# Patient Record
Sex: Male | Born: 1990 | Race: White | Hispanic: No | Marital: Married | Smoking: Former smoker
Health system: Southern US, Community
[De-identification: ages and names within clinical notes are randomized; demographics above are authoritative.]

## PROBLEM LIST (undated history)

## (undated) DIAGNOSIS — F32A Depression, unspecified: Secondary | ICD-10-CM

## (undated) DIAGNOSIS — N200 Calculus of kidney: Secondary | ICD-10-CM

## (undated) DIAGNOSIS — F329 Major depressive disorder, single episode, unspecified: Secondary | ICD-10-CM

## (undated) DIAGNOSIS — Z87442 Personal history of urinary calculi: Secondary | ICD-10-CM

## (undated) DIAGNOSIS — F419 Anxiety disorder, unspecified: Secondary | ICD-10-CM

## (undated) HISTORY — DX: Anxiety disorder, unspecified: F41.9

## (undated) HISTORY — PX: APPENDECTOMY: SHX54

---
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
## 2008-11-23 ENCOUNTER — Emergency Department: Payer: Self-pay | Admitting: Emergency Medicine

## 2010-07-05 ENCOUNTER — Emergency Department: Payer: Self-pay | Admitting: Emergency Medicine

## 2011-06-29 ENCOUNTER — Emergency Department: Payer: Self-pay | Admitting: Emergency Medicine

## 2011-07-01 ENCOUNTER — Emergency Department: Payer: Self-pay | Admitting: Emergency Medicine

## 2011-10-01 ENCOUNTER — Emergency Department: Payer: Self-pay | Admitting: Emergency Medicine

## 2011-11-05 ENCOUNTER — Emergency Department: Payer: Self-pay | Admitting: Emergency Medicine

## 2011-12-11 ENCOUNTER — Emergency Department: Payer: Self-pay | Admitting: Emergency Medicine

## 2012-12-10 ENCOUNTER — Emergency Department: Payer: Self-pay

## 2012-12-10 LAB — DRUG SCREEN, URINE
Barbiturates, Ur Screen: NEGATIVE (ref ?–200)
Benzodiazepine, Ur Scrn: NEGATIVE (ref ?–200)
Cocaine Metabolite,Ur ~~LOC~~: NEGATIVE (ref ?–300)
MDMA (Ecstasy)Ur Screen: NEGATIVE (ref ?–500)
Methadone, Ur Screen: NEGATIVE (ref ?–300)
Phencyclidine (PCP) Ur S: NEGATIVE (ref ?–25)
Tricyclic, Ur Screen: NEGATIVE (ref ?–1000)

## 2012-12-10 LAB — COMPREHENSIVE METABOLIC PANEL
Albumin: 4.4 g/dL (ref 3.4–5.0)
Alkaline Phosphatase: 108 U/L (ref 50–136)
Bilirubin,Total: 0.4 mg/dL (ref 0.2–1.0)
Calcium, Total: 9.2 mg/dL (ref 8.5–10.1)
EGFR (African American): >60
Osmolality: 272 (ref 275–301)
Total Protein: 8.6 g/dL — ABNORMAL HIGH (ref 6.4–8.2)

## 2012-12-10 LAB — URINALYSIS, COMPLETE
Bacteria: NONE SEEN
Bilirubin,UR: NEGATIVE
Blood: NEGATIVE
Ph: 7 (ref 4.5–8.0)
Squamous Epithelial: <1

## 2012-12-10 LAB — CBC
HCT: 44.7 % (ref 40.0–52.0)
MCH: 28.9 pg (ref 26.0–34.0)
MCHC: 33.1 g/dL (ref 32.0–36.0)
MCV: 87 fL (ref 80–100)
RDW: 13.9 % (ref 11.5–14.5)

## 2013-08-28 ENCOUNTER — Observation Stay: Payer: Self-pay | Admitting: Surgery

## 2013-08-28 LAB — COMPREHENSIVE METABOLIC PANEL
AST: 44 U/L — AB (ref 15–37)
Albumin: 4.4 g/dL (ref 3.4–5.0)
Alkaline Phosphatase: 101 U/L
Anion Gap: 8 (ref 7–16)
BUN: 9 mg/dL (ref 7–18)
Bilirubin,Total: 0.6 mg/dL (ref 0.2–1.0)
CHLORIDE: 102 mmol/L (ref 98–107)
Calcium, Total: 9.4 mg/dL (ref 8.5–10.1)
Co2: 29 mmol/L (ref 21–32)
Creatinine: 1.17 mg/dL (ref 0.60–1.30)
GLUCOSE: 97 mg/dL (ref 65–99)
Osmolality: 276 (ref 275–301)
Potassium: 3.7 mmol/L (ref 3.5–5.1)
SGPT (ALT): 46 U/L
Sodium: 139 mmol/L (ref 136–145)
TOTAL PROTEIN: 9.1 g/dL — AB (ref 6.4–8.2)

## 2013-08-28 LAB — CBC
HCT: 45.5 % (ref 40.0–52.0)
HGB: 15.1 g/dL (ref 13.0–18.0)
MCH: 29.1 pg (ref 26.0–34.0)
MCHC: 33.1 g/dL (ref 32.0–36.0)
MCV: 88 fL (ref 80–100)
PLATELETS: 248 10*3/uL (ref 150–440)
RBC: 5.18 10*6/uL (ref 4.40–5.90)
RDW: 15.1 % — AB (ref 11.5–14.5)
WBC: 17.2 10*3/uL — ABNORMAL HIGH (ref 3.8–10.6)

## 2013-08-28 LAB — URINALYSIS, COMPLETE
BACTERIA: NONE SEEN
BLOOD: NEGATIVE
Bilirubin,UR: NEGATIVE
Glucose,UR: NEGATIVE mg/dL (ref 0–75)
Ketone: NEGATIVE
LEUKOCYTE ESTERASE: NEGATIVE
NITRITE: NEGATIVE
Ph: 5 (ref 4.5–8.0)
Protein: NEGATIVE
RBC,UR: 1 /HPF (ref 0–5)
SPECIFIC GRAVITY: 1.023 (ref 1.003–1.030)
Squamous Epithelial: NONE SEEN

## 2013-08-28 LAB — LIPASE, BLOOD: LIPASE: 226 U/L (ref 73–393)

## 2013-08-30 LAB — PATHOLOGY REPORT

## 2014-05-20 NOTE — Op Note (Signed)
PATIENT NAME:  Adam Palmer, Adam Palmer MR#:  [id number] DATE OF BIRTH:  1990-06-20  DATE OF PROCEDURE:  08/28/2013  PREOPERATIVE DIAGNOSIS: Acute appendicitis.   POSTOPERATIVE DIAGNOSIS: Acute appendicitis.   SURGEON: Claude MangesWilliam F. Yomara Toothman, MD  ANESTHESIA: General.   OPERATION PERFORMED: Laparoscopic appendectomy.   PROCEDURE IN DETAIL: The patient was placed supine on the operating room table and prepped and draped in the usual sterile fashion. A 15 mmHg CO2 pneumoperitoneum was created via a Hasson cannula that was introduced in the supraumbilical midline amidst horizontal mattress sutures of 0 Vicryl. Two additional 5 mm trocars were placed under direct visualization. The appendix was inflamed with minimal, if any, exudate and no surrounding pus. It was retrocecal in location. The mesoappendix was taken down with the Harmonic scalpel, and the appendectomy was performed to include a sliver of the cecal base with the Endo GIA stapling device. It was placed in an Endo Catch bag and extracted from the abdomen via the epigastric port. The right lower quadrant was irrigated with warm normal saline. This was suctioned clear, and the peritoneum was desufflated and decannulated, and the linea alba was closed with a single interrupted 0 PDS suture. The previously placed Vicryls were tied one to another, and all 3 skin sites were closed with subcuticular 5-0 Monocryl and suture strips. The patient tolerated the procedure well. There were no complications.    ____________________________ Claude MangesWilliam F. Blease Capaldi, MD wfm:jb D: 08/28/2013 09:09:00 ET T: 08/28/2013 10:32:18 ET JOB#: [id number]  cc: Claude MangesWilliam F. Keyara Ent, MD, <Dictator> Claude MangesWILLIAM F Matie Dimaano MD ELECTRONICALLY SIGNED 08/28/2013 15:26

## 2014-05-20 NOTE — H&P (Signed)
   Subjective/Chief Complaint RLQ pain, nausea, chills x 24 hours   History of Present Illness Adam Palmer is a relatively healthy 24 yo M who presents with 1 day of acute onset, worsening/nonresolving RLQ pain, nausea and chills.  Began suddenly, worse with movement.  Last PO 5 pm.  Has never had before.  No sick contacts, no unusual ingestions.   Past History Kidney stones   Past Med/Surgical Hx:  Denies medical history:   Denies surgical history.:   denies:   ALLERGIES:  No Known Allergies:   Family and Social History:  Family History Diabetes Mellitus   Social History positive  tobacco, positive ETOH, + chewing tobacco   Place of Living Home   Review of Systems:  Subjective/Chief Complaint RLQ pain, nausea, chills   Fever/Chills Yes   Cough No   Sputum No   Abdominal Pain Yes   Diarrhea No   Constipation No   Nausea/Vomiting Yes   SOB/DOE No   Chest Pain No   Dysuria No   Tolerating PT No   Tolerating Diet Nauseated   Physical Exam:  GEN well developed, well nourished, no acute distress   HEENT pink conjunctivae, PERRL, moist oral mucosa, good dentition   RESP normal resp effort  clear BS   CARD regular rate  no murmur   ABD positive tenderness  denies Flank Tenderness  no liver/spleen enlargement   LYMPH negative neck, negative axillae   SKIN normal to palpation, No rashes, No ulcers, skin turgor good   NEURO cranial nerves intact, negative Babinski R/L, negative rigidity, negative tremor   PSYCH A+O to time, place, person, good insight    Assessment/Admission Diagnosis RLQ pain, nausea, chills.  + leukocytosis, Thickened dilated appendix with periappendiceal stranding on CT scan.  Clinical, laboratory and radiographic appendicitis.   Plan Admit, IVF, IV abx.  Lap appendectomy in am with daytime surgeon Marterre.   Electronic Signatures: Jarvis NewcomerLundquist, Izick A (MD)  (Signed 02-Aug-15 04:09)  Authored: CHIEF COMPLAINT and HISTORY, PAST  MEDICAL/SURGIAL HISTORY, ALLERGIES, FAMILY AND SOCIAL HISTORY, REVIEW OF SYSTEMS, PHYSICAL EXAM, ASSESSMENT AND PLAN   Last Updated: 02-Aug-15 04:09 by Jarvis NewcomerLundquist, Clayborne A (MD)

## 2015-08-23 ENCOUNTER — Emergency Department
Admission: EM | Admit: 2015-08-23 | Discharge: 2015-08-23 | Disposition: A | Discharge status: Home / Self Care | Payer: Managed Care, Other (non HMO) | Attending: Student | Admitting: Student

## 2015-08-23 ENCOUNTER — Emergency Department: Payer: Managed Care, Other (non HMO)

## 2015-08-23 DIAGNOSIS — R1031 Right lower quadrant pain: Secondary | ICD-10-CM | POA: Diagnosis present

## 2015-08-23 DIAGNOSIS — N23 Unspecified renal colic: Secondary | ICD-10-CM | POA: Insufficient documentation

## 2015-08-23 DIAGNOSIS — N200 Calculus of kidney: Secondary | ICD-10-CM

## 2015-08-23 DIAGNOSIS — R109 Unspecified abdominal pain: Secondary | ICD-10-CM

## 2015-08-23 LAB — URINALYSIS COMPLETE WITH MICROSCOPIC (ARMC ONLY)
BILIRUBIN URINE: NEGATIVE
Bacteria, UA: NONE SEEN
GLUCOSE, UA: NEGATIVE mg/dL
Ketones, ur: NEGATIVE mg/dL
LEUKOCYTES UA: NEGATIVE
Nitrite: NEGATIVE
PH: 7 (ref 5.0–8.0)
Protein, ur: NEGATIVE mg/dL
SPECIFIC GRAVITY, URINE: 1.016 (ref 1.005–1.030)
Squamous Epithelial / LPF: NONE SEEN

## 2015-08-23 LAB — CBC WITH DIFFERENTIAL/PLATELET
BASOS ABS: 0.1 10*3/uL (ref 0–0.1)
Basophils Relative: 1 %
Eosinophils Absolute: 0.2 10*3/uL (ref 0–0.7)
Eosinophils Relative: 3 %
HCT: 45.4 % (ref 40.0–52.0)
HEMOGLOBIN: 15.5 g/dL (ref 13.0–18.0)
LYMPHS ABS: 3.2 10*3/uL (ref 1.0–3.6)
LYMPHS PCT: 34 %
MCH: 29.4 pg (ref 26.0–34.0)
MCHC: 34.2 g/dL (ref 32.0–36.0)
MCV: 85.8 fL (ref 80.0–100.0)
Monocytes Absolute: 1 10*3/uL (ref 0.2–1.0)
Monocytes Relative: 11 %
NEUTROS ABS: 4.7 10*3/uL (ref 1.4–6.5)
NEUTROS PCT: 51 %
PLATELETS: 231 10*3/uL (ref 150–440)
RBC: 5.28 MIL/uL (ref 4.40–5.90)
RDW: 14.8 % — ABNORMAL HIGH (ref 11.5–14.5)
WBC: 9.2 10*3/uL (ref 3.8–10.6)

## 2015-08-23 LAB — BASIC METABOLIC PANEL
ANION GAP: 8 (ref 5–15)
BUN: 10 mg/dL (ref 6–20)
CHLORIDE: 102 mmol/L (ref 101–111)
CO2: 29 mmol/L (ref 22–32)
Calcium: 9.5 mg/dL (ref 8.9–10.3)
Creatinine, Ser: 1.1 mg/dL (ref 0.61–1.24)
GFR calc Af Amer: >60 mL/min (ref >60)
GLUCOSE: 115 mg/dL — AB (ref 65–99)
POTASSIUM: 3.4 mmol/L — AB (ref 3.5–5.1)
SODIUM: 139 mmol/L (ref 135–145)

## 2015-08-23 MED ORDER — TAMSULOSIN HCL 0.4 MG PO CAPS: 0.4000 mg | ORAL_CAPSULE | Freq: Every day | ORAL | 0 refills | Status: DC

## 2015-08-23 MED ORDER — HYDROMORPHONE HCL 1 MG/ML IJ SOLN
1.0000 mg | Freq: Once | INTRAMUSCULAR | Status: AC
  Administered 2015-08-23: 1 mg via INTRAVENOUS

## 2015-08-23 MED ORDER — ONDANSETRON 4 MG PO TBDP
4.0000 mg | ORAL_TABLET | Freq: Three times a day (TID) | ORAL | 0 refills | Status: DC | PRN
Start: 2015-08-23 — End: 2015-09-09

## 2015-08-23 MED ORDER — HYDROMORPHONE HCL 1 MG/ML IJ SOLN
INTRAMUSCULAR | Status: AC
  Administered 2015-08-23: 1 mg via INTRAVENOUS
  Filled 2015-08-23: qty 1

## 2015-08-23 MED ORDER — ONDANSETRON HCL 4 MG/2ML IJ SOLN
INTRAMUSCULAR | Status: AC
  Administered 2015-08-23: 4 mg via INTRAVENOUS
  Filled 2015-08-23: qty 2

## 2015-08-23 MED ORDER — OXYCODONE HCL 5 MG PO TABS
10.0000 mg | ORAL_TABLET | Freq: Once | ORAL | Status: AC
  Administered 2015-08-23: 10 mg via ORAL
  Filled 2015-08-23: qty 2

## 2015-08-23 MED ORDER — OXYCODONE HCL 5 MG PO TABS: 5.0000 mg | ORAL_TABLET | Freq: Four times a day (QID) | ORAL | 0 refills | Status: DC | PRN

## 2015-08-23 MED ORDER — ONDANSETRON HCL 4 MG/2ML IJ SOLN
4.0000 mg | Freq: Once | INTRAMUSCULAR | Status: AC
  Administered 2015-08-23: 4 mg via INTRAVENOUS

## 2015-08-23 MED ORDER — SODIUM CHLORIDE 0.9 % IV BOLUS (SEPSIS)
1000.0000 mL | Freq: Once | INTRAVENOUS | Status: AC
  Administered 2015-08-23: 1000 mL via INTRAVENOUS

## 2015-08-23 NOTE — ED Notes (Signed)
Pt returned from US, resting in bed, family at bedside 

## 2015-08-23 NOTE — ED Notes (Signed)
Patient transported to Ultrasound 

## 2015-08-23 NOTE — ED Notes (Signed)
Pt resting in bed and family at the bedside.

## 2015-08-23 NOTE — ED Provider Notes (Signed)
[facility] Emergency Department Provider Note   ____________________________________________   First MD Initiated Contact with Patient 08/23/15 0700     (approximate)  I have reviewed the triage vital signs and the nursing notes.   HISTORY  Chief Complaint Flank Pain    HPI DEZMON TORONTO is a 25 y.o. male with history of anxiety, prior kidney stones who presents for evaluation of sudden onset right flank pain since 3 AM, constant, severe, feels like his prior kidney stones. He has also had associated nonbloody nonbilious emesis. No diarrhea, fevers or chills. Denies dysuria or hematuria. Last bowel movement was yesterday. No chest pain or difficulty breathing.   No past medical history on file.  There are no active problems to display for this patient.   No past surgical history on file.  Prior to Admission medications   Not on File    Allergies Review of patient's allergies indicates no known allergies.  No family history on file.  Social History Social History  Substance Use Topics  . Smoking status: Not on file  . Smokeless tobacco: Not on file  . Alcohol use Not on file    Review of Systems Constitutional: No fever/chills Eyes: No visual changes. ENT: No sore throat. Cardiovascular: Denies chest pain. Respiratory: Denies shortness of breath. Gastrointestinal: No abdominal pain.  + nausea, + vomiting.  No diarrhea.  No constipation. Genitourinary: Negative for dysuria. Musculoskeletal: Negative for back pain. Skin: Negative for rash. Neurological: Negative for headaches, focal weakness or numbness.  10-point ROS otherwise negative.  ____________________________________________   PHYSICAL EXAM:  VITAL SIGNS: ED Triage Vitals  Enc Vitals Group     BP 08/23/15 0625 (!) 157/82     Pulse Rate 08/23/15 0625 66     Resp 08/23/15 0625 18     Temp 08/23/15 0625 97.5 F (36.4 C)     Temp Source 08/23/15 0625 Oral    SpO2 08/23/15 0625 100 %     Weight --      Height --      Head Circumference --      Peak Flow --      Pain Score 08/23/15 0622 10     Pain Loc --      Pain Edu? --      Excl. in GC? --     Constitutional: Alert and oriented. Well appearing and in no acute distress. Eyes: Conjunctivae are normal. PERRL. EOMI. Head: Atraumatic. Nose: No congestion/rhinnorhea. Mouth/Throat: Mucous membranes are moist.  Oropharynx non-erythematous. Neck: No stridor.   Cardiovascular: Normal rate, regular rhythm. Grossly normal heart sounds.  Good peripheral circulation. Respiratory: Normal respiratory effort.  No retractions. Lungs CTAB. Gastrointestinal: Soft and nontender. No distention. No CVA tenderness. Genitourinary: deferred Musculoskeletal: No lower extremity tenderness nor edema.  No joint effusions. Neurologic:  Normal speech and language. No gross focal neurologic deficits are appreciated. No gait instability. Skin:  Skin is warm, dry and intact. No rash noted. Psychiatric: Mood and affect are normal. Speech and behavior are normal.  ____________________________________________   LABS (all labs ordered are listed, but only abnormal results are displayed)  Labs Reviewed  CBC WITH DIFFERENTIAL/PLATELET - Abnormal; Notable for the following:       Result Value   RDW 14.8 (*)    All other components within normal limits  BASIC METABOLIC PANEL - Abnormal; Notable for the following:    Potassium 3.4 (*)    Glucose, Bld 115 (*)    All other  components within normal limits  URINALYSIS COMPLETEWITH MICROSCOPIC ([facility] ONLY) - Abnormal; Notable for the following:    Color, Urine YELLOW (*)    APPearance CLEAR (*)    Hgb urine dipstick 3+ (*)    All other components within normal limits   ____________________________________________  EKG  none ____________________________________________  RADIOLOGY  Renal Ultrasound IMPRESSION: Mild RIGHT hydronephrosis.  No intrarenal  calculi. Electronically Signed  ____________________________________________   PROCEDURES  Procedure(s) performed: None  Procedures  Critical Care performed: No  ____________________________________________   INITIAL IMPRESSION / ASSESSMENT AND PLAN / ED COURSE  Pertinent labs & imaging results that were available during my care of the patient were reviewed by me and considered in my medical decision making (see chart for details).  AASHRITH EVES is a 26 y.o. male with history of anxiety, prior kidney stones who presents for evaluation of sudden onset right flank pain since 3 AM, constant, severe, feels like his prior kidney stones. On exam, he is generally well-appearing and in no acute distress after receiving Dilaudid and Zofran and fluids prior to my arrival. Currently he reports he feels much better. His vital signs are stable, he is afebrile. He has a benign abdominal exam. No CVA tenderness. Reading labs, renal ultrasound and treat him symptomatically as I suspect this may represent a recurrent kidney stone. Reassess for disposition.  ----------------------------------------- 9:19 AM on 08/23/2015 -----------------------------------------  patient with significant improvement of his pain at this time, he is tolerating by mouth intake without vomiting. His vital signs are stable. Workup was remarkable for hematuria, renal ultrasound shows mild right hydronephrosis likely secondary to small/nonobstructing ureteral stone which does not appear infected. We'll discharge with expectant management. We discussed return precautions, need for close urology follow-up and he is comfortable with the discharge plan. DC home.  Clinical Course     ____________________________________________   FINAL CLINICAL IMPRESSION(S) / ED DIAGNOSES  Final diagnoses:  Flank pain  Ureteral colic  Nephrolithiasis      NEW MEDICATIONS STARTED DURING THIS VISIT:  New Prescriptions   No  medications on file     Note:  This document was prepared using Dragon voice recognition software and may include unintentional dictation errors.    Gayla Doss, MD 08/23/15 [phone]

## 2015-08-23 NOTE — ED Triage Notes (Signed)
Patient ambulatory to triage with steady gait, without difficulty or distress noted; pt reports right flank pain radiating into right side/abdomen; st hx kidney stones

## 2015-08-23 NOTE — ED Notes (Signed)
Pt resting in bed w/ family at the bedside.

## 2015-09-03 ENCOUNTER — Ambulatory Visit: Payer: Self-pay | Admitting: Urology

## 2015-09-09 ENCOUNTER — Emergency Department
Admission: EM | Admit: 2015-09-09 | Discharge: 2015-09-09 | Disposition: A | Discharge status: Home / Self Care | Payer: Managed Care, Other (non HMO) | Attending: Emergency Medicine | Admitting: Emergency Medicine

## 2015-09-09 ENCOUNTER — Encounter: Payer: Self-pay | Admitting: Emergency Medicine

## 2015-09-09 ENCOUNTER — Emergency Department: Payer: Managed Care, Other (non HMO)

## 2015-09-09 DIAGNOSIS — Z87891 Personal history of nicotine dependence: Secondary | ICD-10-CM | POA: Diagnosis not present

## 2015-09-09 DIAGNOSIS — N200 Calculus of kidney: Secondary | ICD-10-CM | POA: Insufficient documentation

## 2015-09-09 DIAGNOSIS — R109 Unspecified abdominal pain: Secondary | ICD-10-CM | POA: Diagnosis present

## 2015-09-09 HISTORY — DX: Calculus of kidney: N20.0

## 2015-09-09 LAB — CBC WITH DIFFERENTIAL/PLATELET
BASOS PCT: 1 %
Basophils Absolute: 0.1 10*3/uL (ref 0–0.1)
EOS ABS: 0.4 10*3/uL (ref 0–0.7)
Eosinophils Relative: 3 %
HCT: 43 % (ref 40.0–52.0)
HEMOGLOBIN: 14.6 g/dL (ref 13.0–18.0)
Lymphocytes Relative: 36 %
Lymphs Abs: 4.4 10*3/uL — ABNORMAL HIGH (ref 1.0–3.6)
MCH: 29.3 pg (ref 26.0–34.0)
MCHC: 33.9 g/dL (ref 32.0–36.0)
MCV: 86.5 fL (ref 80.0–100.0)
Monocytes Absolute: 1.4 10*3/uL — ABNORMAL HIGH (ref 0.2–1.0)
Monocytes Relative: 12 %
NEUTROS PCT: 48 %
Neutro Abs: 6 10*3/uL (ref 1.4–6.5)
Platelets: 241 10*3/uL (ref 150–440)
RBC: 4.97 MIL/uL (ref 4.40–5.90)
RDW: 13.8 % (ref 11.5–14.5)
WBC: 12.3 10*3/uL — AB (ref 3.8–10.6)

## 2015-09-09 LAB — BASIC METABOLIC PANEL
Anion gap: 5 (ref 5–15)
BUN: 15 mg/dL (ref 6–20)
CALCIUM: 9.5 mg/dL (ref 8.9–10.3)
CO2: 29 mmol/L (ref 22–32)
CREATININE: 1.09 mg/dL (ref 0.61–1.24)
Chloride: 103 mmol/L (ref 101–111)
Glucose, Bld: 100 mg/dL — ABNORMAL HIGH (ref 65–99)
Potassium: 3.4 mmol/L — ABNORMAL LOW (ref 3.5–5.1)
Sodium: 137 mmol/L (ref 135–145)

## 2015-09-09 LAB — URINALYSIS COMPLETE WITH MICROSCOPIC (ARMC ONLY)
Bilirubin Urine: NEGATIVE
GLUCOSE, UA: NEGATIVE mg/dL
Ketones, ur: NEGATIVE mg/dL
Leukocytes, UA: NEGATIVE
NITRITE: NEGATIVE
Protein, ur: 30 mg/dL — AB
SPECIFIC GRAVITY, URINE: 1.028 (ref 1.005–1.030)
pH: 5 (ref 5.0–8.0)

## 2015-09-09 MED ORDER — HYDROMORPHONE HCL 1 MG/ML IJ SOLN
INTRAMUSCULAR | Status: AC
  Filled 2015-09-09: qty 1

## 2015-09-09 MED ORDER — ONDANSETRON HCL 4 MG/2ML IJ SOLN
4.0000 mg | Freq: Once | INTRAMUSCULAR | Status: AC
  Administered 2015-09-09: 4 mg via INTRAVENOUS

## 2015-09-09 MED ORDER — OXYCODONE-ACETAMINOPHEN 5-325 MG PO TABS
ORAL_TABLET | ORAL | Status: AC
  Filled 2015-09-09: qty 1

## 2015-09-09 MED ORDER — ONDANSETRON HCL 4 MG/2ML IJ SOLN
INTRAMUSCULAR | Status: AC
  Filled 2015-09-09: qty 2

## 2015-09-09 MED ORDER — ONDANSETRON 4 MG PO TBDP: 4.0000 mg | ORAL_TABLET | Freq: Three times a day (TID) | ORAL | 0 refills | Status: DC | PRN

## 2015-09-09 MED ORDER — OXYCODONE-ACETAMINOPHEN 5-325 MG PO TABS
1.0000 | ORAL_TABLET | Freq: Once | ORAL | Status: AC
  Administered 2015-09-09: 1 via ORAL

## 2015-09-09 MED ORDER — HYDROMORPHONE HCL 1 MG/ML IJ SOLN
1.0000 mg | Freq: Once | INTRAMUSCULAR | Status: AC
  Administered 2015-09-09: 1 mg via INTRAVENOUS

## 2015-09-09 MED ORDER — OXYCODONE HCL 5 MG PO TABS: 5.0000 mg | ORAL_TABLET | Freq: Four times a day (QID) | ORAL | 0 refills | Status: DC | PRN

## 2015-09-09 MED ORDER — SODIUM CHLORIDE 0.9 % IV BOLUS (SEPSIS)
1000.0000 mL | Freq: Once | INTRAVENOUS | Status: AC
  Administered 2015-09-09: 1000 mL via INTRAVENOUS

## 2015-09-09 MED ORDER — TAMSULOSIN HCL 0.4 MG PO CAPS: 0.4000 mg | ORAL_CAPSULE | Freq: Every day | ORAL | 0 refills | Status: DC

## 2015-09-09 NOTE — ED Notes (Signed)
Patient transported to CT 

## 2015-09-09 NOTE — ED Triage Notes (Signed)
Patient with right flank pain that radiates to right groin.

## 2015-09-09 NOTE — ED Provider Notes (Addendum)
[facility] Emergency Department Provider Note  ____________________________________________   I have reviewed the triage vital signs and the nursing notes.   HISTORY  Chief Complaint Flank Pain    HPI Adam Palmer is a 25 y.o. male who presents today complaining of flank pain. Patient was seen and evaluated for this on the 27thright flank pain persists. Denies dysuria or significant hematuria. Patient states that the pain seemed to get better after his ultrasound here but then it got worse again so he went to his primary care doctor they did an ultrasound and found more kidney stones, he has not had a CT with this investigation of his kidney stone pain, but he has not had pain off and on for last 16 days. Became acutely worse again this morning at 3 AM. Severe right sided kidney stone like pain. No radiation nothing makes it better and nothing makes it worse. Patient did take his last pill this morning, pain pill. He also has yet to follow up with urology, he doesn't appointment he states with [facility] urology but he elected in the interim to go to see his primary care doctor.     Past Medical History:  Diagnosis Date  . Kidney calculi     There are no active problems to display for this patient.   Past Surgical History:  Procedure Laterality Date  . APPENDECTOMY      Current Outpatient Rx  . Order #: [id number] Class: Print  . Order #: [id number] Class: Print  . Order #: [id number] Class: Print    Allergies Review of patient's allergies indicates no known allergies.  No family history on file.  Social History Social History  Substance Use Topics  . Smoking status: Former Games developer  . Smokeless tobacco: Current User    Types: Chew  . Alcohol use Yes    Review of Systems Constitutional: No fever/chills Eyes: No visual changes. ENT: No sore throat. No stiff neck no neck pain Cardiovascular: Denies chest pain. Respiratory: Denies  shortness of breath. Gastrointestinal:   no vomiting.  No diarrhea.  No constipation. Genitourinary: Negative for dysuria. Musculoskeletal: Negative lower extremity swelling Skin: Negative for rash. Neurological: Negative for severe headaches, focal weakness or numbness. 10-point ROS otherwise negative.  ____________________________________________   PHYSICAL EXAM:  VITAL SIGNS: ED Triage Vitals  Enc Vitals Group     BP 09/09/15 0452 (!) 145/100     Pulse Rate 09/09/15 0452 71     Resp 09/09/15 0452 (!) 22     Temp 09/09/15 0452 97.3 F (36.3 C)     Temp Source 09/09/15 0452 Oral     SpO2 09/09/15 0452 100 %     Weight 09/09/15 0453 193 lb (87.5 kg)     Height 09/09/15 0453  (1.727 m)     Head Circumference --      Peak Flow --      Pain Score 09/09/15 0453 10     Pain Loc --      Pain Edu? --      Excl. in GC? --     Constitutional: Alert and oriented. Pacing around the room looking uncomfortable Mouth/Throat: Mucous membranes are moist.  Oropharynx non-erythematous. Neck: No stridor.   Nontender with no meningismus Cardiovascular: Normal rate, regular rhythm. Grossly normal heart sounds.  Good peripheral circulation. Respiratory: Normal respiratory effort.  No retractions. Lungs CTAB. Abdominal: Soft and nontender. No distention. No guarding no rebound Back:  There is no focal tenderness or step off.  there is no midline tenderness there are no lesions noted. there is positive right CVA tenderness Musculoskeletal: No lower extremity tenderness, no upper extremity tenderness. No joint effusions, no DVT signs strong distal pulses no edema Neurologic:  Normal speech and language. No gross focal neurologic deficits are appreciated.  Skin:  Skin is warm, dry and intact. No rash noted. Psychiatric: Mood and affect are normal. Speech and behavior are normal.  ____________________________________________   LABS (all labs ordered are listed, but only abnormal results are  displayed)  Labs Reviewed  BASIC METABOLIC PANEL  CBC WITH DIFFERENTIAL/PLATELET  URINALYSIS COMPLETEWITH MICROSCOPIC ([facility] ONLY)   ____________________________________________  EKG  I personally interpreted any EKGs ordered by me or triage  ____________________________________________  RADIOLOGY  I reviewed any imaging ordered by me or triage that were performed during my shift and, if possible, patient and/or family made aware of any abnormal findings. ____________________________________________   PROCEDURES  Procedure(s) performed: None  Procedures  Critical Care performed: None  ____________________________________________   INITIAL IMPRESSION / ASSESSMENT AND PLAN / ED COURSE  Pertinent labs & imaging results that were available during my care of the patient were reviewed by me and considered in my medical decision making (see chart for details).  Patient with history of kidney stones now with 16 days of off and on significant discomfort we'll treat his pain, check renal function urinalysis CBC and I will have to obtain a CT scan on this gentleman because we need to know what exactly is causing his pain and he has had nothing but ultrasound supple now. Y agree that in general is a good idea to limits radiation of patient's with recurrent kidney stones, failure to pass a stone for several weeks with ongoing severe discomfort I think mandates some further investigation that only CT can provide.   ----------------------------------------- 6:24 AM on 09/09/2015 -----------------------------------------  Patient feeling much better resting completely pain free. Blood work reassuring her and is reassuring CT scan to my read shows a stone at the UVJ, we will see what the official read is. We will send him home with Flomax pain meds and we will re-again stress the need for outpatient urologic assistance  Clinical Course    ____________________________________________   FINAL CLINICAL IMPRESSION(S) / ED DIAGNOSES  Final diagnoses:  Kidney stones      This chart was dictated using voice recognition software.  Despite best efforts to proofread,  errors can occur which can change meaning.      Jeanmarie PlantJames A Rosalva Neary, MD 09/09/15 [id number]    Jeanmarie PlantJames A Kaleem Sartwell, MD 09/09/15 [phone]

## 2015-09-19 ENCOUNTER — Ambulatory Visit (INDEPENDENT_AMBULATORY_CARE_PROVIDER_SITE_OTHER): Payer: Managed Care, Other (non HMO) | Admitting: Urology

## 2015-09-19 ENCOUNTER — Encounter: Payer: Self-pay | Admitting: Urology

## 2015-09-19 VITALS — BP 117/79 | HR 91 | Ht 69.0 in | Wt 195.9 lb

## 2015-09-19 DIAGNOSIS — N1330 Unspecified hydronephrosis: Secondary | ICD-10-CM | POA: Diagnosis not present

## 2015-09-19 DIAGNOSIS — N200 Calculus of kidney: Secondary | ICD-10-CM | POA: Diagnosis not present

## 2015-09-19 DIAGNOSIS — R3129 Other microscopic hematuria: Secondary | ICD-10-CM

## 2015-09-19 DIAGNOSIS — N201 Calculus of ureter: Secondary | ICD-10-CM | POA: Diagnosis not present

## 2015-09-19 LAB — MICROSCOPIC EXAMINATION
Bacteria, UA: NONE SEEN
EPITHELIAL CELLS (NON RENAL): NONE SEEN /HPF (ref 0–10)
RBC, UA: NONE SEEN /hpf (ref 0–?)

## 2015-09-19 LAB — URINALYSIS, COMPLETE
BILIRUBIN UA: NEGATIVE
Glucose, UA: NEGATIVE
KETONES UA: NEGATIVE
LEUKOCYTES UA: NEGATIVE
Nitrite, UA: NEGATIVE
PROTEIN UA: NEGATIVE
RBC UA: NEGATIVE
SPEC GRAV UA: 1.01 (ref 1.005–1.030)
UUROB: 0.2 mg/dL (ref 0.2–1.0)
pH, UA: 7 (ref 5.0–7.5)

## 2015-09-19 NOTE — Progress Notes (Signed)
09/19/2015 11:27 AM   Adam Palmer 1990-07-02 [id number]  Referring provider: Domenic Schwabheryl Paulette Lindley, FNP [address] [place], [place] [address]  Chief Complaint  Patient presents with  . Flank Pain    referred by ER  . Nephrolithiasis    HPI: Patient is a 25 year old Caucasian male who presents/is referred by Domenic Schwabheryl Paulette Lindley, FNP for nephrolithiasis.  Patient states the onset of the pain was one month ago.  It was sharp and dull.   It lasted for several hours.  The pain was located right flank and radiated to right waist.  The pain was a 10/10.  Nothing made the pain better.   Nothing made the pain worse.  He did not have gross hematuria, fevers or chills, but he had nausea and vomiting.  In the ED on 08/23/2015, RUS noted mild right hydronephrosis.  UA noted TNTC RBC's/hpf.  .  Serum creatinine was 1.10.  Prior serum creatinine was 1.17.   He was given IV Dilaudid and discharged with oxycodone.    He returned to the ED on 09/09/2015 for persisting right flank pain.  CT Renal stone study noted an obstructing 3 mm right UVJ stone and a 5 mm non obstructing left lower pole stone.  He was given IV Dilaudid and discharged on tamsulosin and Percocet.  He was instructed to follow up with urology.  He has a prior history of stones.  He has not seen an urologist in the past.  His stone composition is unknown at this time.  Today, he is no longer having right flank pain.  He did not pass a fragment to his knowledge.  He is having dull left flank pain.  He is having frequency, dysuria, intermittency and straining to urinate.  His UA today is unremarkable.   PMH: Past Medical History:  Diagnosis Date  . Anxiety   . Kidney calculi     Surgical History: Past Surgical History:  Procedure Laterality Date  . APPENDECTOMY      Home Medications:    Medication List       Accurate as of 09/19/15 11:27 AM. Always use your most recent med list.            ondansetron 4 MG disintegrating tablet Commonly known as:  ZOFRAN ODT Take 1 tablet (4 mg total) by mouth every 8 (eight) hours as needed for nausea or vomiting.   oxyCODONE 5 MG immediate release tablet Commonly known as:  ROXICODONE Take 1 tablet (5 mg total) by mouth every 6 (six) hours as needed for moderate pain. Do not drive while taking this medication.   pravastatin 40 MG tablet Commonly known as:  PRAVACHOL Take 40 mg by mouth daily.   tamsulosin 0.4 MG Caps capsule Commonly known as:  FLOMAX Take 1 capsule (0.4 mg total) by mouth daily.   venlafaxine 75 MG tablet Commonly known as:  EFFEXOR Take 75 mg by mouth 2 (two) times daily.       Allergies: No Known Allergies  Family History: Family History  Problem Relation Age of Onset  . Kidney disease Neg Hx     Social History:  reports that he has quit smoking. His smokeless tobacco use includes Chew. He reports that he drinks alcohol. He reports that he does not use drugs.  ROS: UROLOGY Frequent Urination?: Yes Hard to postpone urination?: No Burning/pain with urination?: Yes Get up at night to urinate?: No Leakage of urine?: No Urine stream starts and  stops?: Yes Trouble starting stream?: No Do you have to strain to urinate?: Yes Blood in urine?: No Urinary tract infection?: No Sexually transmitted disease?: No Injury to kidneys or bladder?: No Painful intercourse?: No Weak stream?: No Erection problems?: No Penile pain?: No  Gastrointestinal Nausea?: No Vomiting?: No Indigestion/heartburn?: No Diarrhea?: No Constipation?: No  Constitutional Fever: No Night sweats?: No Weight loss?: No Fatigue?: No  Skin Skin rash/lesions?: No Itching?: No  Eyes Blurred vision?: No Double vision?: No  Ears/Nose/Throat Sore throat?: No Sinus problems?: No  Hematologic/Lymphatic Swollen glands?: No Easy bruising?: No  Cardiovascular Leg swelling?: No Chest pain?: No  Respiratory Cough?:  No Shortness of breath?: No  Endocrine Excessive thirst?: No  Musculoskeletal Back pain?: No Joint pain?: No  Neurological Headaches?: No Dizziness?: No  Psychologic Depression?: No Anxiety?: Yes  Physical Exam: BP 117/79   Pulse 91   Ht 5\' 9"  (1.753 m)   Wt 195 lb 14.4 oz (88.9 kg)   BMI 28.93 kg/m   Constitutional: Well nourished. Alert and oriented, No acute distress. HEENT: [place] AT, moist mucus membranes. Trachea midline, no masses. Cardiovascular: No clubbing, cyanosis, or edema. Respiratory: Normal respiratory effort, no increased work of breathing. GI: Abdomen is soft, non tender, non distended, no abdominal masses. Liver and spleen not palpable.  No hernias appreciated.  Stool sample for occult testing is not indicated.   GU: No CVA tenderness.  No bladder fullness or masses.  Patient with circumcised phallus.   Urethral meatus is patent.  No penile discharge. No penile lesions or rashes. Scrotum without lesions, cysts, rashes and/or edema.  Testicles are located scrotally bilaterally. No masses are appreciated in the testicles. Left and right epididymis are normal. Rectal: Deferred.  Skin: No rashes, bruises or suspicious lesions. Lymph: No cervical or inguinal adenopathy. Neurologic: Grossly intact, no focal deficits, moving all 4 extremities. Psychiatric: Normal mood and affect.  Laboratory Data: Lab Results  Component Value Date   WBC 12.3 (H) 09/09/2015   HGB 14.6 09/09/2015   HCT 43.0 09/09/2015   MCV 86.5 09/09/2015   PLT 241 09/09/2015    Lab Results  Component Value Date   CREATININE 1.09 09/09/2015    Lab Results  Component Value Date   AST 44 (H) 08/28/2013   Lab Results  Component Value Date   ALT 46 08/28/2013    Urinalysis UA is unremarkable.  See EPIC.    Pertinent Imaging: CLINICAL DATA:  Subacute onset of right flank pain. Initial encounter.  EXAM: CT ABDOMEN AND PELVIS WITHOUT CONTRAST  TECHNIQUE: Multidetector CT  imaging of the abdomen and pelvis was performed following the standard protocol without IV contrast.  COMPARISON:  Renal ultrasound performed 08/23/2015, and CT of the abdomen and pelvis performed 08/28/2013  FINDINGS: The visualized lung bases are clear.  The liver and spleen are unremarkable in appearance. The gallbladder is within normal limits. The pancreas and adrenal glands are unremarkable.  An obstructing 3 mm stone is noted at the distal right ureter, just above the right vesicoureteral junction. There is associated mild right-sided hydronephrosis. No significant perinephric stranding is seen. A nonobstructing 5 mm stone is noted at the lower pole of the left kidney.  No free fluid is identified. The small bowel is unremarkable in appearance. The stomach is within normal limits. No acute vascular abnormalities are seen.  The patient is status post appendectomy. The colon is grossly unremarkable in appearance.  The bladder is mildly distended and grossly unremarkable. The prostate remains normal  in size. No inguinal lymphadenopathy is seen.  No acute osseous abnormalities are identified.  IMPRESSION: 1. Obstructing 3 mm stone at the distal right ureter, just above the right vesicoureteral junction, with mild right-sided hydronephrosis. 2. Nonobstructing 5 mm stone at the lower pole of the left kidney.   Electronically Signed   By: Roanna RaiderJeffery  Chang M.D.   On: 09/09/2015 06:27  Assessment & Plan:    1. Right ureteral stone  - no fragment for analysis  2. Right hydronephrosis  - obtain RUS to ensure the hydronephrosis has resolved.    3. Microscopic hematuria  - UA today demonstrates no hematuria  - continue to monitor the patient's UA after the treatment/passage of the stone to ensure the hematuria has resolved  - if hematuria persists, we will pursue a hematuria workup with CT Urogram and cystoscopy if appropriate.  - Urinalysis, Complete  -  CULTURE, URINE COMPREHENSIVE  4. Left nephrolithiasis  - explained to the patient that a non obstructing stone usually does not cause pain  - possible the stone has migrated to the ureter  - explained to patient that we need to ensure the right stone has passed and the hydronephrosis has resolved before addressing the left renal stone  - Advised to contact our office or seek treatment in the ED if becomes febrile or pain/ vomiting are difficult control in order to arrange for emergent/urgent intervention  Return for RUS report .  These notes generated with voice recognition software. I apologize for typographical errors.  Michiel CowboySHANNON Caresse Sedivy, PA-C  [facility] [address], [address] [place], [place] [address] [phone]

## 2015-09-21 LAB — CULTURE, URINE COMPREHENSIVE

## 2015-09-21 LAB — PLEASE NOTE

## 2015-10-02 ENCOUNTER — Emergency Department
Admission: EM | Admit: 2015-10-02 | Discharge: 2015-10-02 | Disposition: A | Discharge status: Home / Self Care | Payer: Managed Care, Other (non HMO) | Attending: Emergency Medicine | Admitting: Emergency Medicine

## 2015-10-02 ENCOUNTER — Ambulatory Visit
Admission: RE | Admit: 2015-10-02 | Discharge: 2015-10-02 | Disposition: A | Discharge status: Home / Self Care | Payer: Managed Care, Other (non HMO) | Source: Ambulatory Visit | Attending: Urology | Admitting: Urology

## 2015-10-02 ENCOUNTER — Encounter: Payer: Self-pay | Admitting: Emergency Medicine

## 2015-10-02 ENCOUNTER — Telehealth: Payer: Self-pay

## 2015-10-02 DIAGNOSIS — R109 Unspecified abdominal pain: Secondary | ICD-10-CM | POA: Diagnosis present

## 2015-10-02 DIAGNOSIS — Z87891 Personal history of nicotine dependence: Secondary | ICD-10-CM | POA: Insufficient documentation

## 2015-10-02 DIAGNOSIS — N200 Calculus of kidney: Secondary | ICD-10-CM | POA: Insufficient documentation

## 2015-10-02 DIAGNOSIS — N201 Calculus of ureter: Secondary | ICD-10-CM

## 2015-10-02 LAB — URINALYSIS COMPLETE WITH MICROSCOPIC (ARMC ONLY)
BACTERIA UA: NONE SEEN
SQUAMOUS EPITHELIAL / LPF: NONE SEEN
Specific Gravity, Urine: 1.021 (ref 1.005–1.030)

## 2015-10-02 MED ORDER — TAMSULOSIN HCL 0.4 MG PO CAPS: 0.4000 mg | ORAL_CAPSULE | Freq: Every day | ORAL | 0 refills | Status: DC

## 2015-10-02 MED ORDER — HYDROMORPHONE HCL 2 MG PO TABS: 2.0000 mg | ORAL_TABLET | Freq: Two times a day (BID) | ORAL | 0 refills | Status: DC | PRN

## 2015-10-02 MED ORDER — CEPHALEXIN 500 MG PO CAPS: 500.0000 mg | ORAL_CAPSULE | Freq: Two times a day (BID) | ORAL | 0 refills | Status: AC

## 2015-10-02 NOTE — ED Notes (Signed)
Discharge instructions reviewed with patient. Patient verbalized understanding. Patient ambulated to lobby without difficulty.   

## 2015-10-02 NOTE — Discharge Instructions (Signed)
Please continue drinking plenty of fluids and take over-the-counter ibuprofen around the clock for pain. Use the Dilaudid for as needed discomfort. We added a urine culture to see if there is any signs of infection. Those results should not be back for 24-48 hours. If the Keflex does not cover an infection then you will be notified. Contact your urologist for further outpatient follow-up

## 2015-10-02 NOTE — ED Provider Notes (Signed)
Time Seen: Approximately 1950  I have reviewed the triage notes  Chief Complaint: Flank Pain   History of Present Illness: Adam Palmer is a 25 y.o. male who has a known history of renal colic. Patient was recently diagnosed per CAT scan of a small stone 3 mm in size located in the right distal UVJ region. He states that he has seen a urologist and had ultrasound performed prior to arrival today. Review of the ultrasound shows no signs of obstructive uropathy. His main concern is that he continues to have burning with urination. The urologist and advised him to take Azo over-the-counter. He denies any hematuria. He states he is out of his pain medication and just took Tylenol yesterday. He denies any fever or back or flank pain. Denies any testicular pain or masses. He apparently has had a lesion on the tip of his penis that was described as a small blood which seems to be resolving over time. He denies any issues as far as penile discharge or drainage or initiation of urination.   Past Medical History:  Diagnosis Date  . Anxiety   . Kidney calculi     There are no active problems to display for this patient.   Past Surgical History:  Procedure Laterality Date  . APPENDECTOMY      Past Surgical History:  Procedure Laterality Date  . APPENDECTOMY      Current Outpatient Rx  . Order #: [id number] Class: Print  . Order #: [id number] Class: Print  . Order #: [id number] Class: Print  . Order #: [id number] Class: Historical Med  . Order #: [id number] Class: Print  . Order #: [id number] Class: Historical Med    Allergies:  Review of patient's allergies indicates no known allergies.  Family History: Family History  Problem Relation Age of Onset  . Kidney disease Neg Hx     Social History: Social History  Substance Use Topics  . Smoking status: Former Games developer  . Smokeless tobacco: Current User    Types: Chew     Comment: quit 9 years  . Alcohol use Yes     Review of  Systems:   10 point review of systems was performed and was otherwise negative:  Constitutional: No fever Eyes: No visual disturbances ENT: No sore throat, ear pain Cardiac: No chest pain Respiratory: No shortness of breath, wheezing, or stridor Abdomen: No abdominal pain, no vomiting, No diarrhea. He denies any flank discomfort Endocrine: No weight loss, No night sweats Extremities: No peripheral edema, cyanosis Skin: No rashes, easy bruising Neurologic: No focal weakness, trouble with speech or swollowing Urologic: Painful urination, Hematuria, or urinary frequency *  Physical Exam:  ED Triage Vitals  Enc Vitals Group     BP 10/02/15 1744 119/78     Pulse Rate 10/02/15 1744 93     Resp 10/02/15 1744 18     Temp 10/02/15 1744 98.1 F (36.7 C)     Temp Source 10/02/15 1744 Oral     SpO2 10/02/15 1744 97 %     Weight 10/02/15 1745 195 lb (88.5 kg)     Height 10/02/15 1745 5\' 9"  (1.753 m)     Head Circumference --      Peak Flow --      Pain Score 10/02/15 1745 9     Pain Loc --      Pain Edu? --      Excl. in GC? --     General: Awake , Alert , and Oriented times 3;  GCS 15 Head: Normal cephalic , atraumatic Eyes: Pupils equal , round, reactive to light Nose/Throat: No nasal drainage, patent upper airway without erythema or exudate.  Neck: Supple, Full range of motion, No anterior adenopathy or palpable thyroid masses Lungs: Clear to ascultation without wheezes , rhonchi, or rales Heart: Regular rate, regular rhythm without murmurs , gallops , or rubs Abdomen: Soft, non tender without rebound, guarding , or rigidity; bowel sounds positive and symmetric in all 4 quadrants. No organomegaly .        Extremities: 2 plus symmetric pulses. No edema, clubbing or cyanosis Neurologic: normal ambulation, Motor symmetric without deficits, sensory intact Skin: warm, dry, no rashes Very small punctate lesion noticed on the tip of the penis. No surrounding erythema, exudate or  drainage. No testicular pain or masses and no inguinal hernias noted  Labs:   All laboratory work was reviewed including any pertinent negatives or positives listed below:  Labs Reviewed  URINALYSIS COMPLETEWITH MICROSCOPIC ([facility] ONLY) - Abnormal; Notable for the following:       Result Value   Color, Urine ORANGE (*)    APPearance CLEAR (*)    Glucose, UA   (*)    Value: TEST NOT REPORTED DUE TO COLOR INTERFERENCE OF URINE PIGMENT   Bilirubin Urine   (*)    Value: TEST NOT REPORTED DUE TO COLOR INTERFERENCE OF URINE PIGMENT   Ketones, ur   (*)    Value: TEST NOT REPORTED DUE TO COLOR INTERFERENCE OF URINE PIGMENT   Hgb urine dipstick   (*)    Value: TEST NOT REPORTED DUE TO COLOR INTERFERENCE OF URINE PIGMENT   Protein, ur   (*)    Value: TEST NOT REPORTED DUE TO COLOR INTERFERENCE OF URINE PIGMENT   Nitrite   (*)    Value: TEST NOT REPORTED DUE TO COLOR INTERFERENCE OF URINE PIGMENT   Leukocytes, UA   (*)    Value: TEST NOT REPORTED DUE TO COLOR INTERFERENCE OF URINE PIGMENT   All other components within normal limits  URINE CULTURE  Urine difficult to assess secondary to the 80s which is causing interference. Urine culture is pending    Radiology: * "[place] Renal  Result Date: 10/02/2015 CLINICAL DATA:  Pain and urination. EXAM: RENAL / URINARY TRACT ULTRASOUND COMPLETE COMPARISON:  CT 09/09/2015. FINDINGS: Right Kidney: Length: 12.4 cm. Echogenicity within normal limits. No mass or hydronephrosis visualized. Left Kidney: Length: 11.5 cm. Echogenicity within normal limits. No mass or hydronephrosis visualized. 6 mm nonobstructing calculus. Bladder: Appears normal for degree of bladder distention. IMPRESSION: 1.  6 mm nonobstructing calculus left kidney. 2. Bilateral renal contour irregularity suggesting scarring. No hydronephrosis. No bladder distention . Electronically Signed   By: Maisie Fushomas  Register   On: 10/02/2015 10:09   Ct Renal Stone Study  Result Date: 09/09/2015 CLINICAL  DATA:  Subacute onset of right flank pain. Initial encounter. EXAM: CT ABDOMEN AND PELVIS WITHOUT CONTRAST TECHNIQUE: Multidetector CT imaging of the abdomen and pelvis was performed following the standard protocol without IV contrast. COMPARISON:  Renal ultrasound performed 08/23/2015, and CT of the abdomen and pelvis performed 08/28/2013 FINDINGS: The visualized lung bases are clear. The liver and spleen are unremarkable in appearance. The gallbladder is within normal limits. The pancreas and adrenal glands are unremarkable. An obstructing 3 mm stone is noted at the distal right ureter, just above the right vesicoureteral junction. There is associated mild right-sided hydronephrosis. No significant perinephric stranding is seen. A nonobstructing 5 mm stone  is noted at the lower pole of the left kidney. No free fluid is identified. The small bowel is unremarkable in appearance. The stomach is within normal limits. No acute vascular abnormalities are seen. The patient is status post appendectomy. The colon is grossly unremarkable in appearance. The bladder is mildly distended and grossly unremarkable. The prostate remains normal in size. No inguinal lymphadenopathy is seen. No acute osseous abnormalities are identified. IMPRESSION: 1. Obstructing 3 mm stone at the distal right ureter, just above the right vesicoureteral junction, with mild right-sided hydronephrosis. 2. Nonobstructing 5 mm stone at the lower pole of the left kidney. Electronically Signed   By: Roanna Raider M.D.   On: 09/09/2015 06:27  " I personally reviewed the radiologic studies    ED Course: Patient's stay here was uneventful and I didn't feel further objective studies were necessary at this time. Patient had a urine culture added as I cannot determine if he has an infection or not. Patient will be started on outpatient oral antibiotics just in case. The patient seems to have mostly burning with urination and given his differential which  included orchitis, prostatitis, sexually transmitted diseases with urethritis, kidney stone pain etc. I felt he may have some discomfort from a recently passed stone. He does not describe any abdominal or flank pain on the right side. Patient has been referred back to his urologist. He states the Flomax seemed to help and he is currently out of that medication also. Clinical Course     Assessment:  Dysuria History of renal colic   Final Clinical Impression:   Final diagnoses:  Kidney stone     Plan: * Outpatient " Discharge Medication List as of 10/02/2015  8:08 PM    START taking these medications   Details  cephALEXin (KEFLEX) 500 MG capsule Take 1 capsule (500 mg total) by mouth 2 (two) times daily., Starting Tue 10/02/2015, Until Tue 10/09/2015, Print    HYDROmorphone (DILAUDID) 2 MG tablet Take 1 tablet (2 mg total) by mouth every 12 (twelve) hours as needed for severe pain., Starting Tue 10/02/2015, Print      " Patient was advised to return immediately if condition worsens. Patient was advised to follow up with their primary care physician or other specialized physicians involved in their outpatient care. The patient and/or family member/power of attorney had laboratory results reviewed at the bedside. All questions and concerns were addressed and appropriate discharge instructions were distributed by the nursing staff.            [name], MD 10/02/15 2218

## 2015-10-02 NOTE — Telephone Encounter (Signed)
Pt called wanting RUS results stating he was in pain and needed something now. Per [place] RUS was negative and no pain medication can be given. Made pt aware. Pt then stated he only burns on urination. Made pt aware can get AZO OTC. Pt voiced understanding.

## 2015-10-02 NOTE — ED Triage Notes (Signed)
Pt to ed with c/o right flank pain and right sided groin pain.  Pt states he had U/S done today.  Pt reports hx of kidney stones, currently being treated for same but states he is out of pain meds.

## 2015-10-04 LAB — URINE CULTURE: CULTURE: NO GROWTH

## 2015-10-09 ENCOUNTER — Ambulatory Visit: Payer: Managed Care, Other (non HMO) | Admitting: Urology

## 2015-12-23 ENCOUNTER — Emergency Department
Admission: EM | Admit: 2015-12-23 | Discharge: 2015-12-23 | Disposition: A | Discharge status: Home / Self Care | Payer: Managed Care, Other (non HMO) | Attending: Emergency Medicine | Admitting: Emergency Medicine

## 2015-12-23 ENCOUNTER — Emergency Department: Payer: Managed Care, Other (non HMO)

## 2015-12-23 ENCOUNTER — Encounter: Payer: Self-pay | Admitting: Emergency Medicine

## 2015-12-23 DIAGNOSIS — M5412 Radiculopathy, cervical region: Secondary | ICD-10-CM | POA: Insufficient documentation

## 2015-12-23 DIAGNOSIS — M5410 Radiculopathy, site unspecified: Secondary | ICD-10-CM | POA: Diagnosis not present

## 2015-12-23 DIAGNOSIS — Z79899 Other long term (current) drug therapy: Secondary | ICD-10-CM | POA: Insufficient documentation

## 2015-12-23 DIAGNOSIS — F17220 Nicotine dependence, chewing tobacco, uncomplicated: Secondary | ICD-10-CM | POA: Insufficient documentation

## 2015-12-23 DIAGNOSIS — M792 Neuralgia and neuritis, unspecified: Secondary | ICD-10-CM

## 2015-12-23 DIAGNOSIS — M542 Cervicalgia: Secondary | ICD-10-CM | POA: Diagnosis present

## 2015-12-23 MED ORDER — METHOCARBAMOL 750 MG PO TABS: 750.0000 mg | ORAL_TABLET | Freq: Four times a day (QID) | ORAL | 0 refills | Status: DC

## 2015-12-23 MED ORDER — DEXAMETHASONE SODIUM PHOSPHATE 4 MG/ML IJ SOLN
10.0000 mg | Freq: Once | INTRAMUSCULAR | Status: AC
  Administered 2015-12-23: 10 mg via INTRAMUSCULAR
  Filled 2015-12-23: qty 3

## 2015-12-23 MED ORDER — ORPHENADRINE CITRATE 30 MG/ML IJ SOLN
60.0000 mg | Freq: Two times a day (BID) | INTRAMUSCULAR | Status: DC
  Administered 2015-12-23: 60 mg via INTRAMUSCULAR
  Filled 2015-12-23: qty 2

## 2015-12-23 MED ORDER — DEXAMETHASONE SODIUM PHOSPHATE 4 MG/ML IJ SOLN
INTRAMUSCULAR | Status: AC
  Filled 2015-12-23: qty 1

## 2015-12-23 MED ORDER — METHYLPREDNISOLONE 4 MG PO TBPK: ORAL_TABLET | ORAL | 0 refills | Status: DC

## 2015-12-23 NOTE — ED Triage Notes (Signed)
Pt presents ambulatory to triage with upper back pain which happened around three weeks ago. Pt has been seen for this oain and told that nothing was broken. Pt has been prescribed muscle relaxer's with no help. Pt is in NAD at this time.

## 2015-12-23 NOTE — ED Provider Notes (Signed)
[facility] Emergency Department Provider Note   ____________________________________________   None    (approximate)  I have reviewed the triage vital signs and the nursing notes.   HISTORY  Chief Complaint Back Pain    HPI Adam Palmer is a 25 y.o. male patient complain radicular pain that radiates from his neck to his right shoulder. Patient stated he was seen by his PCP on 12/03/2015 for this complaint. Patient was given hydrocodone and Flexeril. Patient stated x-rays were taken of his neck and he was told it was no acute findings. I reviewed the patient's records and saw he was seen for right shoulder pain and only a shoulder x-ray was taken. Patient rates pain as a 9/10. Patient stated pain is at the base of his neck causes over to his right shoulder goes down his arm. No palliative measures for this complaint.  Past Medical History:  Diagnosis Date  . Anxiety   . Kidney calculi     There are no active problems to display for this patient.   Past Surgical History:  Procedure Laterality Date  . APPENDECTOMY      Prior to Admission medications   Medication Sig Start Date End Date Taking? Authorizing Provider  HYDROmorphone (DILAUDID) 2 MG tablet Take 1 tablet (2 mg total) by mouth every 12 (twelve) hours as needed for severe pain. 10/02/15   Jennye MoccasinBrian S Quigley, MD  methocarbamol (ROBAXIN-750) 750 MG tablet Take 1 tablet (750 mg total) by mouth 4 (four) times daily. 12/23/15   [name] Neita Landrigan, PA-C  methylPREDNISolone (MEDROL DOSEPAK) 4 MG TBPK tablet Take Tapered dose as directed 12/23/15   [name] Takiera Mayo, PA-C  ondansetron (ZOFRAN ODT) 4 MG disintegrating tablet Take 1 tablet (4 mg total) by mouth every 8 (eight) hours as needed for nausea or vomiting. 09/09/15   Jeanmarie PlantJames A McShane, MD  pravastatin (PRAVACHOL) 40 MG tablet Take 40 mg by mouth daily.    Historical Provider, MD  tamsulosin (FLOMAX) 0.4 MG CAPS capsule Take 1 capsule (0.4 mg total)  by mouth daily. 10/02/15   Jennye MoccasinBrian S Quigley, MD  venlafaxine (EFFEXOR) 75 MG tablet Take 75 mg by mouth 2 (two) times daily.    Historical Provider, MD    Allergies Patient has no known allergies.  Family History  Problem Relation Age of Onset  . Kidney disease Neg Hx     Social History Social History  Substance Use Topics  . Smoking status: Former Games developermoker  . Smokeless tobacco: Current User    Types: Chew     Comment: quit 9 years  . Alcohol use Yes    Review of Systems Constitutional: No fever/chills Eyes: No visual changes. ENT: No sore throat. Cardiovascular: Denies chest pain. Respiratory: Denies shortness of breath. Gastrointestinal: No abdominal pain.  No nausea, no vomiting.  No diarrhea.  No constipation. Genitourinary: Negative for dysuria. Musculoskeletal: Negative, back, and right arm pain Skin: Negative for rash. Neurological: Negative for headaches, focal weakness or numbness.    ____________________________________________   PHYSICAL EXAM:  VITAL SIGNS: ED Triage Vitals [12/23/15 1950]  Enc Vitals Group     BP (!) 148/93     Pulse Rate (!) 110     Resp 18     Temp 98.2 F (36.8 C)     Temp Source Oral     SpO2 100 %     Weight 195 lb (88.5 kg)     Height 5\' 10"  (1.778 m)  Head Circumference      Peak Flow      Pain Score 9     Pain Loc      Pain Edu?      Excl. in GC?     Constitutional: Alert and oriented. Well appearing and in no acute distress. Eyes: Conjunctivae are normal. PERRL. EOMI. Head: Atraumatic. Nose: No congestion/rhinnorhea. Mouth/Throat: Mucous membranes are moist.  Oropharynx non-erythematous. Neck: No stridor.  No cervical spine tenderness to palpation. Hematological/Lymphatic/Immunilogical: No cervical lymphadenopathy. Cardiovascular: Normal rate, regular rhythm. Grossly normal heart sounds.  Good peripheral circulation. Respiratory: Normal respiratory effort.  No retractions. Lungs CTAB. Gastrointestinal: Soft and  nontender. No distention. No abdominal bruits. No CVA tenderness. Musculoskeletal: No obvious deformity of the neck and upper back or right shoulder. Patient is laying on the bed with his right arm behind his head. Patient has full nuchal range of motion of the upper extremity. Strength against resistance 5 over 5.  Neurologic:  Normal speech and language. No gross focal neurologic deficits are appreciated. No gait instability. Skin:  Skin is warm, dry and intact. No rash noted. Psychiatric: Mood and affect are normal. Speech and behavior are normal.  ____________________________________________   LABS (all labs ordered are listed, but only abnormal results are displayed)  Labs Reviewed - No data to display ____________________________________________  EKG   ____________________________________________  RADIOLOGY  No acute findings on x-ray of the cervical spine. ____________________________________________   PROCEDURES  Procedure(s) performed: None  Procedures  Critical Care performed: No  ____________________________________________   INITIAL IMPRESSION / ASSESSMENT AND PLAN / ED COURSE  Pertinent labs & imaging results that were available during my care of the patient were reviewed by me and considered in my medical decision making (see chart for details).  Radicular cervical pain to the right upper extremity. Patient given discharge care instructions. Patient given a prescription for the Medrol Dosepak and Robaxin.  Clinical Course      ____________________________________________   FINAL CLINICAL IMPRESSION(S) / ED DIAGNOSES  Final diagnoses:  Radicular pain in right arm      NEW MEDICATIONS STARTED DURING THIS VISIT:  New Prescriptions   METHOCARBAMOL (ROBAXIN-750) 750 MG TABLET    Take 1 tablet (750 mg total) by mouth 4 (four) times daily.   METHYLPREDNISOLONE (MEDROL DOSEPAK) 4 MG TBPK TABLET    Take Tapered dose as directed     Note:  This  document was prepared using Dragon voice recognition software and may include unintentional dictation errors.    Joni ReiningRonald K Suriyah Vergara, PA-C 12/23/15 2131    Jene Everyobert Kinner, MD 12/23/15 2209

## 2016-10-04 ENCOUNTER — Emergency Department
Admission: EM | Admit: 2016-10-04 | Discharge: 2016-10-04 | Disposition: A | Discharge status: Home / Self Care | Payer: Managed Care, Other (non HMO) | Attending: Emergency Medicine | Admitting: Emergency Medicine

## 2016-10-04 DIAGNOSIS — Z79899 Other long term (current) drug therapy: Secondary | ICD-10-CM | POA: Diagnosis not present

## 2016-10-04 DIAGNOSIS — R319 Hematuria, unspecified: Secondary | ICD-10-CM

## 2016-10-04 DIAGNOSIS — N200 Calculus of kidney: Secondary | ICD-10-CM | POA: Insufficient documentation

## 2016-10-04 DIAGNOSIS — Z87891 Personal history of nicotine dependence: Secondary | ICD-10-CM | POA: Diagnosis not present

## 2016-10-04 LAB — BASIC METABOLIC PANEL
Anion gap: 7 (ref 5–15)
BUN: 10 mg/dL (ref 6–20)
CHLORIDE: 105 mmol/L (ref 101–111)
CO2: 27 mmol/L (ref 22–32)
CREATININE: 0.97 mg/dL (ref 0.61–1.24)
Calcium: 9.9 mg/dL (ref 8.9–10.3)
GFR calc non Af Amer: >60 mL/min (ref >60)
Glucose, Bld: 92 mg/dL (ref 65–99)
POTASSIUM: 3.3 mmol/L — AB (ref 3.5–5.1)
SODIUM: 139 mmol/L (ref 135–145)

## 2016-10-04 LAB — URINALYSIS, COMPLETE (UACMP) WITH MICROSCOPIC
BILIRUBIN URINE: NEGATIVE
Bacteria, UA: NONE SEEN
Glucose, UA: NEGATIVE mg/dL
Ketones, ur: NEGATIVE mg/dL
Leukocytes, UA: NEGATIVE
NITRITE: NEGATIVE
PH: 8 (ref 5.0–8.0)
Protein, ur: NEGATIVE mg/dL
Specific Gravity, Urine: 1.009 (ref 1.005–1.030)
Squamous Epithelial / LPF: NONE SEEN

## 2016-10-04 LAB — CBC
HEMATOCRIT: 39.6 % — AB (ref 40.0–52.0)
Hemoglobin: 13.2 g/dL (ref 13.0–18.0)
MCH: 28.7 pg (ref 26.0–34.0)
MCHC: 33.3 g/dL (ref 32.0–36.0)
MCV: 86.3 fL (ref 80.0–100.0)
Platelets: 289 10*3/uL (ref 150–440)
RBC: 4.59 MIL/uL (ref 4.40–5.90)
RDW: 14.7 % — ABNORMAL HIGH (ref 11.5–14.5)
WBC: 8.5 10*3/uL (ref 3.8–10.6)

## 2016-10-04 MED ORDER — ONDANSETRON HCL 4 MG PO TABS: 4.0000 mg | ORAL_TABLET | Freq: Three times a day (TID) | ORAL | 0 refills | Status: DC | PRN

## 2016-10-04 MED ORDER — TRAMADOL HCL 50 MG PO TABS: 50.0000 mg | ORAL_TABLET | Freq: Four times a day (QID) | ORAL | 0 refills | Status: DC | PRN

## 2016-10-04 NOTE — ED Triage Notes (Signed)
Pt came to ED via pov c/o blood in urine and left sided flank pain. History of kidney stones. VS stable.

## 2016-10-04 NOTE — ED Provider Notes (Signed)
[facility] Emergency Department Provider Note   ____________________________________________   I have reviewed the triage vital signs and the nursing notes.   HISTORY  Chief Complaint Hematuria   History limited by: Not Limited   HPI Adam Palmer is a 26 y.o. male who presents to the emergency department today because of concerns for bloody urine. Patient states he first noticed that his urine had a pink tinge to it last night. He then noticed it again today although he has had some clear episodes of urination interspersed with the darker colored once. He has felt some discomfort on his left side. He has a history of kidney stones and states that he had an ultrasound done a few months ago which showed a kidney stone on the left side. He has not noticed any burning with urination or bad odor to his urine. He denies any fevers.    Past Medical History:  Diagnosis Date  . Anxiety   . Kidney calculi     There are no active problems to display for this patient.   Past Surgical History:  Procedure Laterality Date  . APPENDECTOMY      Prior to Admission medications   Medication Sig Start Date End Date Taking? Authorizing Provider  HYDROmorphone (DILAUDID) 2 MG tablet Take 1 tablet (2 mg total) by mouth every 12 (twelve) hours as needed for severe pain. 10/02/15   Jennye MoccasinQuigley, Brian S, MD  methocarbamol (ROBAXIN-750) 750 MG tablet Take 1 tablet (750 mg total) by mouth 4 (four) times daily. 12/23/15   Joni ReiningSmith, Ronald K, PA-C  methylPREDNISolone (MEDROL DOSEPAK) 4 MG TBPK tablet Take Tapered dose as directed 12/23/15   Joni ReiningSmith, Ronald K, PA-C  ondansetron (ZOFRAN ODT) 4 MG disintegrating tablet Take 1 tablet (4 mg total) by mouth every 8 (eight) hours as needed for nausea or vomiting. 09/09/15   Jeanmarie PlantMcShane, James A, MD  pravastatin (PRAVACHOL) 40 MG tablet Take 40 mg by mouth daily.    [provider]  tamsulosin (FLOMAX) 0.4 MG CAPS capsule Take 1 capsule  (0.4 mg total) by mouth daily. 10/02/15   Jennye MoccasinQuigley, Brian S, MD  venlafaxine (EFFEXOR) 75 MG tablet Take 75 mg by mouth 2 (two) times daily.    [provider]    Allergies Patient has no known allergies.  Family History  Problem Relation Age of Onset  . Kidney disease Neg Hx     Social History Social History  Substance Use Topics  . Smoking status: Former Games developermoker  . Smokeless tobacco: Current User    Types: Chew     Comment: quit 9 years  . Alcohol use Yes    Review of Systems Constitutional: No fever/chills Eyes: No visual changes. ENT: No sore throat. Cardiovascular: Denies chest pain. Respiratory: Denies shortness of breath. Gastrointestinal: Positive for left flank pain.   Genitourinary: Positive for hematuria.  Musculoskeletal: Negative for back pain. Skin: Negative for rash. Neurological: Negative for headaches, focal weakness or numbness.  ____________________________________________   PHYSICAL EXAM:  VITAL SIGNS: ED Triage Vitals [10/04/16 1640]  Enc Vitals Group     BP 128/90     Pulse Rate 81     Resp 16     Temp 98.3 F (36.8 C)     Temp Source Oral     SpO2 100 %     Weight 190 lb (86.2 kg)     Height 5\' 10"  (1.778 m)   Constitutional: Alert and oriented. Well appearing and in no distress.  Eyes: Conjunctivae are normal.  ENT   Head: Normocephalic and atraumatic.   Nose: No congestion/rhinnorhea.   Mouth/Throat: Mucous membranes are moist.   Neck: No stridor. Hematological/Lymphatic/Immunilogical: No cervical lymphadenopathy. Cardiovascular: Normal rate, regular rhythm.  No murmurs, rubs, or gallops.  Respiratory: Normal respiratory effort without tachypnea nor retractions. Breath sounds are clear and equal bilaterally. No wheezes/rales/rhonchi. Gastrointestinal: Soft and minimally tender to palpation to the left flank.  Genitourinary: Deferred Musculoskeletal: Normal range of motion in all extremities. No lower extremity  edema. Neurologic:  Normal speech and language. No gross focal neurologic deficits are appreciated.  Skin:  Skin is warm, dry and intact. No rash noted. Psychiatric: Mood and affect are normal. Speech and behavior are normal. Patient exhibits appropriate insight and judgment.  ____________________________________________    LABS (pertinent positives/negatives)  Labs Reviewed  URINALYSIS, COMPLETE (UACMP) WITH MICROSCOPIC - Abnormal; Notable for the following:       Result Value   Color, Urine YELLOW (*)    APPearance CLOUDY (*)    Hgb urine dipstick LARGE (*)    All other components within normal limits  BASIC METABOLIC PANEL - Abnormal; Notable for the following:    Potassium 3.3 (*)    All other components within normal limits  CBC - Abnormal; Notable for the following:    HCT 39.6 (*)    RDW 14.7 (*)    All other components within normal limits     ____________________________________________   EKG  None  ____________________________________________    RADIOLOGY  None  ____________________________________________   PROCEDURES  Procedures  ____________________________________________   INITIAL IMPRESSION / ASSESSMENT AND PLAN / ED COURSE  Pertinent labs & imaging results that were available during my care of the patient were reviewed by me and considered in my medical decision making (see chart for details).  Patient presented to the emergency department today because of concerns for left sided flank pain and hematuria. Blood work and urine is consistent with a kidney stone. Patient's pain is minimal this time. Did discuss possibility of getting an ultrasound patient felt comfortable deferring at this time. We did discuss return precautions.  ____________________________________________   FINAL CLINICAL IMPRESSION(S) / ED DIAGNOSES  Final diagnoses:  Hematuria, unspecified type  Kidney stone     Note: This dictation was prepared with Dragon  dictation. Any transcriptional errors that result from this process are unintentional     Phineas Semen, MD 10/04/16 1806

## 2016-10-04 NOTE — ED Notes (Signed)
FIRST NURSE NOTE:  C/o blood and urine and has a known kidney stone on the left side.

## 2016-10-04 NOTE — Discharge Instructions (Signed)
Please start taking your Flomax. Please seek medical attention for any high fevers, chest pain, shortness of breath, change in behavior, persistent vomiting, bloody stool or any other new or concerning symptoms.

## 2017-03-20 ENCOUNTER — Encounter: Payer: Self-pay | Admitting: Emergency Medicine

## 2017-03-20 ENCOUNTER — Emergency Department
Admission: EM | Admit: 2017-03-20 | Discharge: 2017-03-20 | Disposition: A | Discharge status: Home / Self Care | Payer: Managed Care, Other (non HMO) | Attending: Emergency Medicine | Admitting: Emergency Medicine

## 2017-03-20 DIAGNOSIS — R110 Nausea: Secondary | ICD-10-CM | POA: Diagnosis not present

## 2017-03-20 DIAGNOSIS — Z7902 Long term (current) use of antithrombotics/antiplatelets: Secondary | ICD-10-CM | POA: Diagnosis not present

## 2017-03-20 DIAGNOSIS — R109 Unspecified abdominal pain: Secondary | ICD-10-CM

## 2017-03-20 DIAGNOSIS — Z79899 Other long term (current) drug therapy: Secondary | ICD-10-CM | POA: Insufficient documentation

## 2017-03-20 DIAGNOSIS — R310 Gross hematuria: Secondary | ICD-10-CM | POA: Insufficient documentation

## 2017-03-20 DIAGNOSIS — F17228 Nicotine dependence, chewing tobacco, with other nicotine-induced disorders: Secondary | ICD-10-CM | POA: Insufficient documentation

## 2017-03-20 LAB — CBC
HEMATOCRIT: 41.5 % (ref 40.0–52.0)
HEMOGLOBIN: 13.7 g/dL (ref 13.0–18.0)
MCH: 28.5 pg (ref 26.0–34.0)
MCHC: 32.9 g/dL (ref 32.0–36.0)
MCV: 86.4 fL (ref 80.0–100.0)
Platelets: 308 10*3/uL (ref 150–440)
RBC: 4.8 MIL/uL (ref 4.40–5.90)
RDW: 14.7 % — ABNORMAL HIGH (ref 11.5–14.5)
WBC: 7.9 10*3/uL (ref 3.8–10.6)

## 2017-03-20 LAB — URINALYSIS, COMPLETE (UACMP) WITH MICROSCOPIC
Bacteria, UA: NONE SEEN
Bilirubin Urine: NEGATIVE
GLUCOSE, UA: NEGATIVE mg/dL
KETONES UR: NEGATIVE mg/dL
Nitrite: NEGATIVE
PROTEIN: 30 mg/dL — AB
SQUAMOUS EPITHELIAL / LPF: NONE SEEN
Specific Gravity, Urine: 1.018 (ref 1.005–1.030)
pH: 6 (ref 5.0–8.0)

## 2017-03-20 LAB — BASIC METABOLIC PANEL
ANION GAP: 9 (ref 5–15)
BUN: 13 mg/dL (ref 6–20)
CALCIUM: 9.8 mg/dL (ref 8.9–10.3)
CO2: 25 mmol/L (ref 22–32)
Chloride: 105 mmol/L (ref 101–111)
Creatinine, Ser: 1.18 mg/dL (ref 0.61–1.24)
Glucose, Bld: 114 mg/dL — ABNORMAL HIGH (ref 65–99)
POTASSIUM: 4.2 mmol/L (ref 3.5–5.1)
SODIUM: 139 mmol/L (ref 135–145)

## 2017-03-20 MED ORDER — SULFAMETHOXAZOLE-TRIMETHOPRIM 800-160 MG PO TABS: 1.0000 | ORAL_TABLET | Freq: Two times a day (BID) | ORAL | 0 refills | Status: DC

## 2017-03-20 MED ORDER — TAMSULOSIN HCL 0.4 MG PO CAPS: 0.4000 mg | ORAL_CAPSULE | Freq: Every day | ORAL | 0 refills | Status: DC

## 2017-03-20 MED ORDER — OXYCODONE-ACETAMINOPHEN 7.5-325 MG PO TABS: 1.0000 | ORAL_TABLET | Freq: Four times a day (QID) | ORAL | 0 refills | Status: DC | PRN

## 2017-03-20 MED ORDER — OXYCODONE-ACETAMINOPHEN 5-325 MG PO TABS
1.0000 | ORAL_TABLET | Freq: Once | ORAL | Status: AC
  Administered 2017-03-20: 1 via ORAL
  Filled 2017-03-20: qty 1

## 2017-03-20 MED ORDER — SODIUM CHLORIDE 0.9 % IV BOLUS (SEPSIS)
1000.0000 mL | Freq: Once | INTRAVENOUS | Status: AC
  Administered 2017-03-20: 1000 mL via INTRAVENOUS

## 2017-03-20 MED ORDER — ONDANSETRON 4 MG PO TBDP: 4.0000 mg | ORAL_TABLET | Freq: Three times a day (TID) | ORAL | 0 refills | Status: DC | PRN

## 2017-03-20 NOTE — ED Triage Notes (Signed)
Pt comes into the ED via POV c/o possible kidney stone and left flank pain.  Patient has h/o kidney stones in the past and states this feels the same.  Patient has nausea associated with the pain. Patient in NAD with even and unlabored respirations and is ambulatory to triage at this time. Denies any urinary difficulties.

## 2017-03-20 NOTE — ED Provider Notes (Signed)
[facility] Emergency Department Provider Note  ____________________________________________   First MD Initiated Contact with Patient 03/20/17 1432     (approximate)  I have reviewed the triage vital signs and the nursing notes.   HISTORY  Chief Complaint Flank Pain  HPI Adam Palmer is a 27 y.o. male is here with complaint of left flank pain.  Patient states that he noticed some hematuria 1 month ago.  Recently he took some Azo thinking that this would help with his symptoms.  Patient has a history of kidney stones.  He states that this feels the same as his last one which was almost a year ago.  Patient had nausea but no vomiting.  He states that his nausea is tolerable at this time.  He denies any urinary difficulties.  Patient has been seen by urology last year.  He denies any fever, chills, nausea or vomiting.   Past Medical History:  Diagnosis Date  . Anxiety   . Kidney calculi     There are no active problems to display for this patient.   Past Surgical History:  Procedure Laterality Date  . APPENDECTOMY      Prior to Admission medications   Medication Sig Start Date End Date Taking? Authorizing Provider  ondansetron (ZOFRAN ODT) 4 MG disintegrating tablet Take 1 tablet (4 mg total) by mouth every 8 (eight) hours as needed for nausea or vomiting. 03/20/17   Tommi RumpsSummers, Ketara Cavness L, PA-C  oxyCODONE-acetaminophen (PERCOCET) 7.5-325 MG tablet Take 1 tablet by mouth every 6 (six) hours as needed for severe pain. 03/20/17 03/20/18  Tommi RumpsSummers, Lachanda Buczek L, PA-C  pravastatin (PRAVACHOL) 40 MG tablet Take 40 mg by mouth daily.    [provider]  sulfamethoxazole-trimethoprim (BACTRIM DS,SEPTRA DS) 800-160 MG tablet Take 1 tablet by mouth 2 (two) times daily. 03/20/17   Tommi RumpsSummers, Zalman Hull L, PA-C  tamsulosin (FLOMAX) 0.4 MG CAPS capsule Take 1 capsule (0.4 mg total) by mouth daily. 03/20/17   Tommi RumpsSummers, Tolulope Pinkett L, PA-C  venlafaxine (EFFEXOR) 75 MG tablet  Take 75 mg by mouth 2 (two) times daily.    [provider]    Allergies Patient has no known allergies.  Family History  Problem Relation Age of Onset  . Kidney disease Neg Hx     Social History Social History   Tobacco Use  . Smoking status: Former Games developermoker  . Smokeless tobacco: Current User    Types: Chew  . Tobacco comment: quit 9 years  Substance Use Topics  . Alcohol use: Yes  . Drug use: No    Review of Systems Constitutional: No fever/chills Cardiovascular: Denies chest pain. Respiratory: Denies shortness of breath. Gastrointestinal: No abdominal pain.  No nausea, no vomiting.  No diarrhea.  No constipation. Genitourinary: Hematuria positive. Musculoskeletal: Negative for back pain. Skin: Negative for rash. Neurological: Negative for headaches, focal weakness or numbness. ____________________________________________   PHYSICAL EXAM:  VITAL SIGNS: ED Triage Vitals  Enc Vitals Group     BP 03/20/17 1114 123/88     Pulse Rate 03/20/17 1114 98     Resp 03/20/17 1114 17     Temp 03/20/17 1114 97.8 F (36.6 C)     Temp Source 03/20/17 1114 Oral     SpO2 03/20/17 1114 100 %     Weight 03/20/17 1115 190 lb (86.2 kg)     Height 03/20/17 1115 5\' 10"  (1.778 m)     Head Circumference --      Peak Flow --  Pain Score 03/20/17 1114 9     Pain Loc --      Pain Edu? --      Excl. in GC? --    Constitutional: Alert and oriented. Well appearing and in no acute distress. Eyes: Conjunctivae are normal.  Head: Atraumatic. Neck: No stridor.   Cardiovascular: Normal rate, regular rhythm. Grossly normal heart sounds.  Good peripheral circulation. Respiratory: Normal respiratory effort.  No retractions. Lungs CTAB. Gastrointestinal: Soft and nontender. No distention.  No CVA tenderness. Musculoskeletal: Moves upper and lower extremities without any difficulty.  Normal gait was noted. Neurologic:  Normal speech and language. No gross focal neurologic deficits  are appreciated.  Skin:  Skin is warm, dry and intact. No rash noted. Psychiatric: Mood and affect are normal. Speech and behavior are normal.  ____________________________________________   LABS (all labs ordered are listed, but only abnormal results are displayed)  Labs Reviewed  URINALYSIS, COMPLETE (UACMP) WITH MICROSCOPIC - Abnormal; Notable for the following components:      Result Value   Color, Urine YELLOW (*)    APPearance CLOUDY (*)    Hgb urine dipstick SMALL (*)    Protein, ur 30 (*)    Leukocytes, UA SMALL (*)    All other components within normal limits  BASIC METABOLIC PANEL - Abnormal; Notable for the following components:   Glucose, Bld 114 (*)    All other components within normal limits  CBC - Abnormal; Notable for the following components:   RDW 14.7 (*)    All other components within normal limits      PROCEDURES  Procedure(s) performed: None  Procedures  Critical Care performed: No  ____________________________________________   INITIAL IMPRESSION / ASSESSMENT AND PLAN / ED COURSE  As part of my medical decision making, I reviewed the following data within the electronic MEDICAL RECORD NUMBER Notes from prior ED visits and [place] Controlled Substance Database  Patient does have a history of renal stones.  Last ultrasound was done in August 2018 which showed a renal stone on the left.  Will follow up with his urologist.  He was discharged in stable condition.  He was given prescriptions for Zofran if needed for nausea or vomiting, Percocet as needed for pain, Bactrim DS twice daily for 10 days and Flomax 1 daily.  Patient is instructed to return to the emergency department if any severe worsening of his symptoms.  Imaging was deferred today as patient's pain was controlled in the ED and no continued nausea or vomiting.  ____________________________________________   FINAL CLINICAL IMPRESSION(S) / ED DIAGNOSES  Final diagnoses:  Left flank pain  Gross  hematuria     ED Discharge Orders        Ordered    tamsulosin (FLOMAX) 0.4 MG CAPS capsule  Daily     03/20/17 1552    ondansetron (ZOFRAN ODT) 4 MG disintegrating tablet  Every 8 hours PRN     03/20/17 1552    oxyCODONE-acetaminophen (PERCOCET) 7.5-325 MG tablet  Every 6 hours PRN     03/20/17 1552    sulfamethoxazole-trimethoprim (BACTRIM DS,SEPTRA DS) 800-160 MG tablet  2 times daily     03/20/17 1552       Note:  This document was prepared using Dragon voice recognition software and may include unintentional dictation errors.    Tommi Rumps, PA-C 03/20/17 1612    Sharyn Creamer, MD 03/20/17 1626

## 2017-03-20 NOTE — Discharge Instructions (Signed)
Follow-up with your urologist or Franco NonesCheryl Lindley if any continued problems. You are discharged with multiple prescriptions.  One Zofran if needed for nausea or vomiting.  Percocet as needed for pain.  Flomax 1 daily and Bactrim DS twice daily for infection.  Return to the emergency department if any severe worsening over the weekend.

## 2017-04-13 NOTE — Progress Notes (Signed)
04/14/2017 8:54 AM   Adam Palmer [birth year] [id number]  Referring provider: Armando Gang, FNP [address] [place], [place] [address]  No chief complaint on file.   HPI: Patient is a 27 year old Caucasian male with a history of nephrolithiasis who was seen at [facility] emergency department on March 20, 2017 for left flank pain.  Background history In the ED on 08/23/2015, RUS noted mild right hydronephrosis.  CT Renal stone study on 09/09/2015  noted an obstructing 3 mm right UVJ stone and a 5 mm non obstructing left lower pole stone.  RUS on 10/02/2015 noted a 6 mm nonobstructing calculus left kidney. Bilateral renal contour irregularity suggesting scarring. No hydronephrosis. No bladder distention.  Stone composition is unknown.    He presented to the ED on 03/20/2017 for left flank pain and gross hematuria.  His UA was positive for TNTC RBC's and 6-30 WBC's.  His serum creatinine was 1.18 which is slightly above his baseline.  His WBC's count was 7.9.  No imaging studies were performed.   Today, he is still having left flank pain radiating to his left waist.  6/10 pain.  Ibuprofen and pain meds help the pain.  Urinating makes the pain worse.  He is having frequency, nocturia, intermittency, straining to urinate, gross hematuria and a weak stream.  He has had nausea but no vomiting.  No fevers or chills.  His UA is on 04/14/2017 (today) is positive for > 30 RBC's and many bacteria.     PMH: Past Medical History:  Diagnosis Date  . Anxiety   . Kidney calculi     Surgical History: Past Surgical History:  Procedure Laterality Date  . APPENDECTOMY      Home Medications:  Allergies as of 04/14/2017   No Known Allergies     Medication List        Accurate as of 04/14/17  8:54 AM. Always use your most recent med list.          fenofibrate 160 MG tablet Take 160 mg by mouth daily.   ondansetron 4 MG disintegrating tablet Commonly known as:  ZOFRAN  ODT Take 1 tablet (4 mg total) by mouth every 8 (eight) hours as needed for nausea or vomiting.   oxyCODONE-acetaminophen 7.5-325 MG tablet Commonly known as:  PERCOCET Take 1 tablet by mouth every 6 (six) hours as needed for severe pain.   pravastatin 40 MG tablet Commonly known as:  PRAVACHOL Take 40 mg by mouth daily.   rosuvastatin 40 MG tablet Commonly known as:  CRESTOR Take 40 mg by mouth daily.   sulfamethoxazole-trimethoprim 800-160 MG tablet Commonly known as:  BACTRIM DS,SEPTRA DS Take 1 tablet by mouth 2 (two) times daily.   tamsulosin 0.4 MG Caps capsule Commonly known as:  FLOMAX Take 1 capsule (0.4 mg total) by mouth daily.   venlafaxine 75 MG tablet Commonly known as:  EFFEXOR Take 75 mg by mouth 2 (two) times daily.       Allergies: No Known Allergies  Family History: Family History  Problem Relation Age of Onset  . Kidney disease Neg Hx   . Prostate cancer Neg Hx   . Bladder Cancer Neg Hx   . Kidney cancer Neg Hx     Social History:  reports that he has quit smoking. His smokeless tobacco use includes chew. He reports that he drinks alcohol. He reports that he does not use drugs.  ROS: UROLOGY Frequent Urination?: Yes Hard to postpone urination?: No  Burning/pain with urination?: No Get up at night to urinate?: Yes Leakage of urine?: No Urine stream starts and stops?: Yes Trouble starting stream?: No Do you have to strain to urinate?: Yes Blood in urine?: Yes Urinary tract infection?: No Sexually transmitted disease?: No Injury to kidneys or bladder?: No Painful intercourse?: No Weak stream?: Yes Erection problems?: No Penile pain?: No  Gastrointestinal Nausea?: No Vomiting?: No Indigestion/heartburn?: No Diarrhea?: No Constipation?: No  Constitutional Fever: No Night sweats?: No Weight loss?: No Fatigue?: No  Skin Skin rash/lesions?: No Itching?: No  Eyes Blurred vision?: No Double vision?: No  Ears/Nose/Throat Sore  throat?: No Sinus problems?: No  Hematologic/Lymphatic Swollen glands?: No Easy bruising?: No  Cardiovascular Leg swelling?: No Chest pain?: No  Respiratory Cough?: No Shortness of breath?: No  Endocrine Excessive thirst?: No  Musculoskeletal Back pain?: No Joint pain?: No  Neurological Headaches?: Yes Dizziness?: Yes  Psychologic Depression?: No Anxiety?: Yes  Physical Exam: BP 122/82   Pulse 91   Resp 16   Ht 5\' 11"  (1.803 m)   Wt 195 lb (88.5 kg)   SpO2 98%   BMI 27.20 kg/m   Constitutional: Well nourished. Alert and oriented, No acute distress. HEENT: [place] AT, moist mucus membranes. Trachea midline, no masses. Cardiovascular: No clubbing, cyanosis, or edema. Respiratory: Normal respiratory effort, no increased work of breathing. GI: Abdomen is soft, non tender, non distended, no abdominal masses. Liver and spleen not palpable.  No hernias appreciated.  Stool sample for occult testing is not indicated.   GU: No CVA tenderness.  No bladder fullness or masses.  Patient had young child with him so no genital or rectal exam performed.  Skin: No rashes, bruises or suspicious lesions. Lymph: No cervical or inguinal adenopathy. Neurologic: Grossly intact, no focal deficits, moving all 4 extremities. Psychiatric: Normal mood and affect.  Laboratory Data: Lab Results  Component Value Date   WBC 7.9 03/20/2017   HGB 13.7 03/20/2017   HCT 41.5 03/20/2017   MCV 86.4 03/20/2017   PLT 308 03/20/2017    Lab Results  Component Value Date   CREATININE 1.18 03/20/2017    Lab Results  Component Value Date   AST 44 (H) 08/28/2013   Lab Results  Component Value Date   ALT 46 08/28/2013    Urinalysis > 30 RBC's.  Many bacter.  See EPIC.    I have reviewed the labs.   Assessment & Plan:    1. Left flank pain CTU is pending Script for Percocet is given I advised the patient that the medication I was prescribing is not narcotic.  Does have the potential  to be addicting and to be at risk for an overdose.  He is to take the medication only as prescribed.  He is not to drive, engage in dangerous activities or make important life decisions while taking this medication he is agreeable to these instructions. Advised to contact our office or seek treatment in the ED if becomes febrile or pain/ vomiting are difficult control in order to arrange for emergent/urgent intervention  2. Gross hematuria   - I explained to the patient that there are a number of causes that can be associated with blood in the urine, such as stones,  BPH, UTI's, damage to the urinary tract and/or cancer.  - At this time, I felt that the patient warranted further urologic evaluation.   The AUA guidelines state that a CT urogram is the preferred imaging study to evaluate hematuria.  -  I explained to the patient that a contrast material will be injected into a vein and that in rare instances, an allergic reaction can result and may even life threatening   The patient denies any allergies to contrast, iodine and/or seafood and is not taking metformin  - Following the imaging study,  I've recommended a cystoscopy. I described how this is performed, typically in an office setting with a flexible cystoscope. We described the risks, benefits, and possible side effects, the most common of which is a minor amount of blood in the urine and/or burning which usually resolves in 24 to 48 hours.    - The patient had the opportunity to ask questions which were answered. Based upon this discussion, the patient is willing to proceed. Therefore, I've ordered: a CT Urogram and cystoscopy.  - The patient will return following all of the above for discussion of the results.   - BUN + creatinine    - UA today demonstrates > 30 RBC's.    - CULTURE, URINE COMPREHENSIVE  3. History of nephrolithiasis Stone composition is unknown    Return for CT Urogram report and cystoscopy.  These notes generated with  voice recognition software. I apologize for typographical errors.  Michiel Cowboy, PA-C  [facility] [address] [place], [place] [address] [phone]

## 2017-04-14 ENCOUNTER — Encounter: Payer: Self-pay | Admitting: Urology

## 2017-04-14 ENCOUNTER — Ambulatory Visit (INDEPENDENT_AMBULATORY_CARE_PROVIDER_SITE_OTHER): Payer: Managed Care, Other (non HMO) | Admitting: Urology

## 2017-04-14 VITALS — BP 122/82 | HR 91 | Resp 16 | Ht 71.0 in | Wt 195.0 lb

## 2017-04-14 DIAGNOSIS — R310 Gross hematuria: Secondary | ICD-10-CM

## 2017-04-14 DIAGNOSIS — R109 Unspecified abdominal pain: Secondary | ICD-10-CM | POA: Diagnosis not present

## 2017-04-14 DIAGNOSIS — Z87442 Personal history of urinary calculi: Secondary | ICD-10-CM

## 2017-04-14 LAB — URINALYSIS, COMPLETE
Bilirubin, UA: NEGATIVE
GLUCOSE, UA: NEGATIVE
LEUKOCYTES UA: NEGATIVE
NITRITE UA: NEGATIVE
Specific Gravity, UA: 1.025 (ref 1.005–1.030)
Urobilinogen, Ur: 1 mg/dL (ref 0.2–1.0)
pH, UA: 6 (ref 5.0–7.5)

## 2017-04-14 LAB — MICROSCOPIC EXAMINATION: Epithelial Cells (non renal): NONE SEEN /hpf (ref 0–10)

## 2017-04-14 MED ORDER — OXYCODONE-ACETAMINOPHEN 7.5-325 MG PO TABS: 1.0000 | ORAL_TABLET | Freq: Four times a day (QID) | ORAL | 0 refills | Status: DC | PRN

## 2017-04-14 MED ORDER — TAMSULOSIN HCL 0.4 MG PO CAPS: 0.4000 mg | ORAL_CAPSULE | Freq: Every day | ORAL | 0 refills | Status: DC

## 2017-04-14 NOTE — Patient Instructions (Addendum)
Hematuria, Adult Hematuria is blood in your urine. It can be caused by a bladder infection, kidney infection, prostate infection, kidney stone, or cancer of your urinary tract. Infections can usually be treated with medicine, and a kidney stone usually will pass through your urine. If neither of these is the cause of your hematuria, further workup to find out the reason may be needed. It is very important that you tell your health care provider about any blood you see in your urine, even if the blood stops without treatment or happens without causing pain. Blood in your urine that happens and then stops and then happens again can be a symptom of a very serious condition. Also, pain is not a symptom in the initial stages of many urinary cancers. Follow these instructions at home:  Drink lots of fluid, 3-4 quarts a day. If you have been diagnosed with an infection, cranberry juice is especially recommended, in addition to large amounts of water.  Avoid caffeine, tea, and carbonated beverages because they tend to irritate the bladder.  Avoid alcohol because it may irritate the prostate.  Take all medicines as directed by your health care provider.  If you were prescribed an antibiotic medicine, finish it all even if you start to feel better.  If you have been diagnosed with a kidney stone, follow your health care provider's instructions regarding straining your urine to catch the stone.  Empty your bladder often. Avoid holding urine for long periods of time.  After a bowel movement, women should cleanse front to back. Use each tissue only once.  Empty your bladder before and after sexual intercourse if you are a male. Contact a health care provider if:  You develop back pain.  You have a fever.  You have a feeling of sickness in your stomach (nausea) or vomiting.  Your symptoms are not better in 3 days. Return sooner if you are getting worse. Get help right away if:  You develop  severe vomiting and are unable to keep the medicine down.  You develop severe back or abdominal pain despite taking your medicines.  You begin passing a large amount of blood or clots in your urine.  You feel extremely weak or faint, or you pass out. This information is not intended to replace advice given to you by your health care provider. Make sure you discuss any questions you have with your health care provider. Document Released: 01/13/2005 Document Revised: 06/21/2015 Document Reviewed: 09/13/2012 Elsevier Interactive Patient Education  2017 Elsevier Inc.  CT Scan A CT scan (computed tomography scan) is an imaging scan. It uses X-rays and a computer to make detailed pictures of different areas inside the body. A CT scan can give more information than a regular X-ray exam. A CT scan provides data about internal organs, soft tissue structures, blood vessels, and bones. In this procedure, the pictures will be taken in a large machine that has an opening (CT scanner). Tell a health care provider about:  Any allergies you have.  All medicines you are taking, including vitamins, herbs, eye drops, creams, and over-the-counter medicines.  Any blood disorders you have.  Any surgeries you have had.  Any medical conditions you have.  Whether you are pregnant or may be pregnant. What are the risks? Generally, this is a safe procedure. However, problems may occur, including:  An allergic reaction to dyes.  Development of cancer from excessive exposure to radiation from multiple CT scans. This is rare.  What happens before   the procedure? Staying hydrated Follow instructions from your health care provider about hydration, which may include:  Up to 2 hours before the procedure - you may continue to drink clear liquids, such as water, clear fruit juice, black coffee, and plain tea.  Eating and drinking restrictions Follow instructions from your health care provider about eating and  drinking, which may include:  24 hours before the procedure - stop drinking caffeinated beverages, such as energy drinks, tea, soda, coffee, and hot chocolate.  8 hours before the procedure - stop eating heavy meals or foods such as meat, fried foods, or fatty foods.  6 hours before the procedure - stop eating light meals or foods, such as toast or cereal.  6 hours before the procedure - stop drinking milk or drinks that contain milk.  2 hours before the procedure - stop drinking clear liquids.  General instructions  Remove any jewelry.  Ask your health care provider about changing or stopping your regular medicines. This is especially important if you are taking diabetes medicines or blood thinners. What happens during the procedure?  You will lie on a table with your arms above your head.  An IV tube may be inserted into one of your veins.  The contrast dye may be injected into the IV tube. You may feel warm or have a metallic taste in your mouth.  The table you will be lying on will move into the CT scanner.  You will be able to see, hear, and talk to the person running the machine while you are in it. Follow that person's instructions.  The CT scanner will move around you to take pictures. Do not move while it is scanning. Staying still helps the scanner to get a good image.  When the best possible pictures have been taken, the machine will be turned off. The table will be moved out of the machine.  The IV tube will be removed. The procedure may vary among health care providers and hospitals. What happens after the procedure?  It is up to you to get the results of your procedure. Ask your health care provider, or the department that is doing the procedure, when your results will be ready. Summary  A CT scan is an imaging scan.  A CT scan uses X-rays and a computer to make detailed pictures of different areas of your body.  Follow instructions from your health care  provider about eating and drinking before the procedure.  You will be able to see, hear, and talk to the person running the machine while you are in it. Follow that person's instructions. This information is not intended to replace advice given to you by your health care provider. Make sure you discuss any questions you have with your health care provider. Document Released: 02/21/2004 Document Revised: 02/16/2016 Document Reviewed: 02/16/2016 Elsevier Interactive Patient Education  2018 Elsevier Inc.  Cystoscopy Cystoscopy is a procedure that is used to help diagnose and sometimes treat conditions that affect that lower urinary tract. The lower urinary tract includes the bladder and the tube that drains urine from the bladder out of the body (urethra). Cystoscopy is performed with a thin, tube-shaped instrument with a light and camera at the end (cystoscope). The cystoscope may be hard (rigid) or flexible, depending on the goal of the procedure.The cystoscope is inserted through the urethra, into the bladder. Cystoscopy may be recommended if you have:  Urinary tractinfections that keep coming back (recurring).  Blood in the urine (hematuria).    Loss of bladder control (urinary incontinence) or an overactive bladder.  Unusual cells found in a urine sample.  A blockage in the urethra.  Painful urination.  An abnormality in the bladder found during an intravenous pyelogram (IVP) or CT scan.  Cystoscopy may also be done to remove a sample of tissue to be examined under a microscope (biopsy). Tell a health care provider about:  Any allergies you have.  All medicines you are taking, including vitamins, herbs, eye drops, creams, and over-the-counter medicines.  Any problems you or family members have had with anesthetic medicines.  Any blood disorders you have.  Any surgeries you have had.  Any medical conditions you have.  Whether you are pregnant or may be pregnant. What are the  risks? Generally, this is a safe procedure. However, problems may occur, including:  Infection.  Bleeding.  Allergic reactions to medicines.  Damage to other structures or organs.  What happens before the procedure?  Ask your health care provider about: ? Changing or stopping your regular medicines. This is especially important if you are taking diabetes medicines or blood thinners. ? Taking medicines such as aspirin and ibuprofen. These medicines can thin your blood. Do not take these medicines before your procedure if your health care provider instructs you not to.  Follow instructions from your health care provider about eating or drinking restrictions.  You may be given antibiotic medicine to help prevent infection.  You may have an exam or testing, such as X-rays of the bladder, urethra, or kidneys.  You may have urine tests to check for signs of infection.  Plan to have someone take you home after the procedure. What happens during the procedure?  To reduce your risk of infection,your health care team will wash or sanitize their hands.  You will be given one or more of the following: ? A medicine to help you relax (sedative). ? A medicine to numb the area (local anesthetic).  The area around the opening of your urethra will be cleaned.  The cystoscope will be passed through your urethra into your bladder.  Germ-free (sterile)fluid will flow through the cystoscope to fill your bladder. The fluid will stretch your bladder so that your surgeon can clearly examine your bladder walls.  The cystoscope will be removed and your bladder will be emptied. The procedure may vary among health care providers and hospitals. What happens after the procedure?  You may have some soreness or pain in your abdomen and urethra. Medicines will be available to help you.  You may have some blood in your urine.  Do not drive for 24 hours if you received a sedative. This information is  not intended to replace advice given to you by your health care provider. Make sure you discuss any questions you have with your health care provider. Document Released: 01/11/2000 Document Revised: 05/24/2015 Document Reviewed: 11/30/2014 Elsevier Interactive Patient Education  2018 Elsevier Inc.  

## 2017-04-15 LAB — BUN+CREAT
BUN/Creatinine Ratio: 10 (ref 9–20)
BUN: 11 mg/dL (ref 6–20)
Creatinine, Ser: 1.1 mg/dL (ref 0.76–1.27)
GFR, EST AFRICAN AMERICAN: 105 mL/min/{1.73_m2} (ref >59)
GFR, EST NON AFRICAN AMERICAN: 91 mL/min/{1.73_m2} (ref >59)

## 2017-04-17 LAB — CULTURE, URINE COMPREHENSIVE

## 2017-04-27 ENCOUNTER — Ambulatory Visit
Admission: RE | Admit: 2017-04-27 | Discharge: 2017-04-27 | Disposition: A | Discharge status: Home / Self Care | Payer: Managed Care, Other (non HMO) | Source: Ambulatory Visit | Attending: Urology | Admitting: Urology

## 2017-04-27 DIAGNOSIS — Z87442 Personal history of urinary calculi: Secondary | ICD-10-CM | POA: Insufficient documentation

## 2017-04-27 DIAGNOSIS — N200 Calculus of kidney: Secondary | ICD-10-CM | POA: Diagnosis not present

## 2017-04-27 DIAGNOSIS — R310 Gross hematuria: Secondary | ICD-10-CM | POA: Insufficient documentation

## 2017-04-27 DIAGNOSIS — R109 Unspecified abdominal pain: Secondary | ICD-10-CM | POA: Diagnosis not present

## 2017-04-27 MED ORDER — IOPAMIDOL (ISOVUE-300) INJECTION 61%
150.0000 mL | Freq: Once | INTRAVENOUS | Status: AC | PRN
  Administered 2017-04-27: 150 mL via INTRAVENOUS

## 2017-04-29 ENCOUNTER — Encounter: Payer: Self-pay | Admitting: Urology

## 2017-04-29 ENCOUNTER — Ambulatory Visit (INDEPENDENT_AMBULATORY_CARE_PROVIDER_SITE_OTHER): Payer: Managed Care, Other (non HMO) | Admitting: Urology

## 2017-04-29 VITALS — BP 124/79 | HR 105 | Resp 16 | Ht 71.0 in | Wt 185.0 lb

## 2017-04-29 DIAGNOSIS — R310 Gross hematuria: Secondary | ICD-10-CM | POA: Diagnosis not present

## 2017-04-29 DIAGNOSIS — N200 Calculus of kidney: Secondary | ICD-10-CM | POA: Insufficient documentation

## 2017-04-29 LAB — MICROSCOPIC EXAMINATION: Epithelial Cells (non renal): NONE SEEN /hpf (ref 0–10)

## 2017-04-29 LAB — URINALYSIS, COMPLETE
Bilirubin, UA: NEGATIVE
Glucose, UA: NEGATIVE
Ketones, UA: NEGATIVE
Leukocytes, UA: NEGATIVE
Nitrite, UA: NEGATIVE
PH UA: 6.5 (ref 5.0–7.5)
Protein, UA: NEGATIVE
Specific Gravity, UA: 1.025 (ref 1.005–1.030)
UUROB: 1 mg/dL (ref 0.2–1.0)

## 2017-04-29 MED ORDER — CIPROFLOXACIN HCL 500 MG PO TABS
500.0000 mg | ORAL_TABLET | Freq: Once | ORAL | Status: AC
  Administered 2017-04-29: 500 mg via ORAL

## 2017-04-29 MED ORDER — LIDOCAINE HCL 2 % EX GEL
1.0000 "application " | Freq: Once | CUTANEOUS | Status: AC
  Administered 2017-04-29: 1 via URETHRAL

## 2017-04-29 NOTE — Progress Notes (Signed)
   04/29/17  CC: No chief complaint on file.   HPI: 27 year old male who recently saw Michiel CowboyShannon McGowan for intermittent hematuria and left flank pain.  CT urogram shows a nonobstructing 9 mm left lower pole calculus.  There was a faint calcification in the midportion of the left kidney which may be a parenchymal calcification.  Blood pressure 124/79, pulse (!) 105, resp. rate 16, height 5\' 11"  (1.803 m), weight 185 lb (83.9 kg), SpO2 98 %. NED. A&Ox3.   No respiratory distress   Abd soft, NT, ND Normal phallus with bilateral descended testicles  Cystoscopy Procedure Note  Patient identification was confirmed, informed consent was obtained, and patient was prepped using Betadine solution.  Lidocaine jelly was administered per urethral meatus.    Preoperative abx where received prior to procedure.     Pre-Procedure: - Inspection reveals a normal caliber ureteral meatus.  Procedure: The flexible cystoscope was introduced without difficulty - No urethral strictures/lesions are present. - Nonocclusive prostate  - Normal bladder neck - Bilateral ureteral orifices identified - Bladder mucosa  reveals no ulcers, tumors, or lesions - No bladder stones - No trabeculation  Retroflexion shows mild erythema bladder neck   Post-Procedure: - Patient tolerated the procedure well  Assessment/ Plan: No significant mucosal abnormalities on cystoscopy.  Urine cytology was ordered.  He has a 9 mm left lower pole calculus which should not be causing his symptoms unless the stone is migrating to the UPJ.  I recommended a KUB when he is symptomatic.  If it does not look like the stone is migrating ideally a cystoscopy when he is actively bleeding would be preferred.  He is a recurrent stone former and have recommended a metabolic evaluation.  Follow-up KUB 3 months if he remains asymptomatic.  I did briefly discuss management options for his 9 mm calculus including surveillance, shockwave  lithotripsy and ureteroscopy.

## 2017-05-04 ENCOUNTER — Other Ambulatory Visit: Payer: Managed Care, Other (non HMO) | Admitting: Urology

## 2017-05-05 ENCOUNTER — Other Ambulatory Visit: Payer: Self-pay | Admitting: Urology

## 2017-05-06 ENCOUNTER — Other Ambulatory Visit: Payer: Self-pay | Admitting: Urology

## 2017-05-12 ENCOUNTER — Telehealth: Payer: Self-pay

## 2017-05-12 DIAGNOSIS — R310 Gross hematuria: Secondary | ICD-10-CM

## 2017-05-12 NOTE — Telephone Encounter (Signed)
-----   Message from Riki AltesScott C Stoioff, MD sent at 05/10/2017 11:52 AM EDT ----- Urine cytology showed no evidence of malignant cells.  There was evidence of bleeding from the kidney.  Recommend a nephrology appointment.

## 2017-05-12 NOTE — Telephone Encounter (Signed)
[facility]

## 2017-05-12 NOTE — Telephone Encounter (Signed)
Spoke with pt in reference to cytology results and referral to nephrology. Pt voiced understanding.

## 2017-05-15 ENCOUNTER — Other Ambulatory Visit: Payer: Self-pay | Admitting: Urology

## 2017-05-18 ENCOUNTER — Telehealth: Payer: Self-pay

## 2017-05-18 ENCOUNTER — Telehealth: Payer: Self-pay | Admitting: Family Medicine

## 2017-05-18 ENCOUNTER — Other Ambulatory Visit: Payer: Self-pay | Admitting: Family Medicine

## 2017-05-18 ENCOUNTER — Ambulatory Visit
Admission: RE | Admit: 2017-05-18 | Discharge: 2017-05-18 | Disposition: A | Discharge status: Home / Self Care | Payer: Managed Care, Other (non HMO) | Source: Ambulatory Visit | Attending: Urology | Admitting: Urology

## 2017-05-18 DIAGNOSIS — N200 Calculus of kidney: Secondary | ICD-10-CM | POA: Insufficient documentation

## 2017-05-18 DIAGNOSIS — R310 Gross hematuria: Secondary | ICD-10-CM

## 2017-05-18 NOTE — Telephone Encounter (Signed)
-----   Message from Riki AltesScott C Stoioff, MD sent at 05/18/2017  3:26 PM EDT ----- KUB shows a lower pole renal calculus which would not be causing pain

## 2017-05-18 NOTE — Telephone Encounter (Signed)
Patient notified on vmail 

## 2017-05-18 NOTE — Telephone Encounter (Signed)
Spoke to patient. I offered for him to have a KUB. He is requesting Percocet and feels his stone has moved. We will look at KUB before deciding on pain medication

## 2017-05-20 ENCOUNTER — Telehealth: Payer: Self-pay | Admitting: Urology

## 2017-05-20 NOTE — Telephone Encounter (Signed)
[facility] said he wanted to wait until he see's you again.  Marcelino DusterMichelle

## 2017-08-09 NOTE — Telephone Encounter (Signed)
That is fine to see me again prior to being seen by nephrology.  He needs an appointment sometime in August.

## 2017-10-29 ENCOUNTER — Encounter: Payer: Self-pay | Admitting: Family Medicine

## 2017-10-29 ENCOUNTER — Ambulatory Visit (INDEPENDENT_AMBULATORY_CARE_PROVIDER_SITE_OTHER): Payer: Managed Care, Other (non HMO) | Admitting: Family Medicine

## 2017-10-29 VITALS — BP 102/68 | HR 92 | Temp 98.3°F | Resp 18 | Ht 70.0 in | Wt 196.2 lb

## 2017-10-29 DIAGNOSIS — F411 Generalized anxiety disorder: Secondary | ICD-10-CM | POA: Diagnosis not present

## 2017-10-29 DIAGNOSIS — R310 Gross hematuria: Secondary | ICD-10-CM

## 2017-10-29 DIAGNOSIS — R5383 Other fatigue: Secondary | ICD-10-CM

## 2017-10-29 DIAGNOSIS — Z7689 Persons encountering health services in other specified circumstances: Secondary | ICD-10-CM

## 2017-10-29 DIAGNOSIS — E781 Pure hyperglyceridemia: Secondary | ICD-10-CM | POA: Diagnosis not present

## 2017-10-29 DIAGNOSIS — N200 Calculus of kidney: Secondary | ICD-10-CM

## 2017-10-29 DIAGNOSIS — Z23 Encounter for immunization: Secondary | ICD-10-CM | POA: Diagnosis not present

## 2017-10-29 DIAGNOSIS — E663 Overweight: Secondary | ICD-10-CM

## 2017-10-29 DIAGNOSIS — G4726 Circadian rhythm sleep disorder, shift work type: Secondary | ICD-10-CM

## 2017-10-29 DIAGNOSIS — Z1159 Encounter for screening for other viral diseases: Secondary | ICD-10-CM

## 2017-10-29 DIAGNOSIS — Z114 Encounter for screening for human immunodeficiency virus [HIV]: Secondary | ICD-10-CM

## 2017-10-29 MED ORDER — ROSUVASTATIN CALCIUM 40 MG PO TABS: 40.0000 mg | ORAL_TABLET | Freq: Every day | ORAL | 0 refills | Status: DC

## 2017-10-29 MED ORDER — VENLAFAXINE HCL 75 MG PO TABS: 75.0000 mg | ORAL_TABLET | Freq: Two times a day (BID) | ORAL | 1 refills | Status: DC

## 2017-10-29 MED ORDER — BUSPIRONE HCL 7.5 MG PO TABS: 7.5000 mg | ORAL_TABLET | Freq: Three times a day (TID) | ORAL | 0 refills | Status: DC

## 2017-10-29 NOTE — Progress Notes (Signed)
Name: Adam Palmer   MRN: [id number]    DOB: 1990/05/23   Date:10/29/2017       Progress Note  Subjective  Chief Complaint  Chief Complaint  Patient presents with  . Establish Care  . Hyperlipidemia    medication refill  . Anxiety    HPI  Pt presents to establish care and for the following concerns:  Hyperlipidemia: Current Medication Regimen: Last Lipids: No recent on file, we will check today as he is fasting.  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  No results found for: CHOL, HDL, LDLCALC, LDLDIRECT, TRIG, CHOLHDL - Current Diet: tries to eat salad, does eat red meat and pork sometimes.   - Denies: Chest pain, shortness of breath, myalgias. Discussed risk of fenofibrate with statin therapy and developing rhabdo - verbalizes understanding.  - Documented aortic atherosclerosis? No - Risk factors for atherosclerosis: None  History Nephrolithiasis and Hematuria: Sees blood in his urine occasionally; saw Dr. Lonna Cobb 04/29/2017 for cystoscopy. Doing okay at this time - takes flomax PRN.  Denies flank or back pain at present - only when he has hematuria.   Anxiety and Panic Attacks: His Dad passed away when he was 13, this is when his anxiety started. He notes Mom and Dad both had/have anxiety too. Current Medication Regimen: Effexor 75mg  ; patient is compliant with this treatment. Current Symptoms Include: Panic attacks occur about once a week - taking Xanax PRN currently - advised on our office policy, he verbalizes understanding.  Has tried Hydroxyzine in the past and it made him too sleepy.  We will try Buspar PRN Pt Denies the following symptoms: SI/HI Seeing Psychiatry? No Attending Counseling? No - Has in the past, does not feel he needs it at this time. PHQ-9 Score of:    Office Visit from 10/29/2017 in [facility]  PHQ-9 Total Score  5     Overweight - Does yard work for about an hour a week; otherwise not exercising.  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  Diet is regular - does not eat out frequently.  Night Shift - doing okay sleeping during the day.  Usually sleeping 9:30-10am until about 3pm.  Does try to catch up on the weekends.  Children - 10yo son 6yo son, and 3yo daughter.  Does endorse some fatigue.  Health Maintenance - Hep C and HIV screening to be done today; declines Gonorrhea/chlamydia or RPR.  Had flu shot last week; TDAP due.  Patient Active Problem List   Diagnosis Date Noted  . Pure hypertriglyceridemia 10/29/2017  . Generalized anxiety disorder 10/29/2017  . Overweight (BMI 25.0-29.9) 10/29/2017  . Fatigue 10/29/2017  . Gross hematuria 04/29/2017  . Nephrolithiasis 04/29/2017    Past Surgical History:  Procedure Laterality Date  . APPENDECTOMY      Family History  Problem Relation Age of Onset  . Anxiety disorder Mother   . Anxiety disorder Father   . Kidney disease Neg Hx   . Prostate cancer Neg Hx   . Bladder Cancer Neg Hx   . Kidney cancer Neg Hx     Social History   Socioeconomic History  . Marital status: Married    Spouse name: Jonette Eva  . Number of children: 3  . Years of education: Not on file  . Highest  education level: Not on file  Occupational History  . Not on file  Social Needs  . Financial resource strain: Not hard at all  . Food insecurity:    Worry: Never true    Inability: Never true  . Transportation needs:    Medical: No    Non-medical: No  Tobacco Use  . Smoking status: Former Games developer  . Smokeless tobacco: Current User    Types: Chew  . Tobacco comment: quit 9 years  Substance and Sexual Activity  . Alcohol use: Yes  . Drug use: No  . Sexual activity: Yes    Partners: Female  Lifestyle  . Physical activity:    Days per week: 2 days    Minutes per session: 40 min  . Stress: Only a little  Relationships  .  Social connections:    Talks on phone: More than three times a week    Gets together: More than three times a week    Attends religious service: Never    Active member of club or organization: No    Attends meetings of clubs or organizations: Never    Relationship status: Married  . Intimate partner violence:    Fear of current or ex partner: No    Emotionally abused: No    Physically abused: No    Forced sexual activity: No  Other Topics Concern  . Not on file  Social History Narrative  . Not on file     Current Outpatient Medications:  .  fenofibrate 160 MG tablet, Take 160 mg by mouth daily., Disp: , Rfl:  .  rosuvastatin (CRESTOR) 40 MG tablet, Take 1 tablet (40 mg total) by mouth daily., Disp: 30 tablet, Rfl: 0 .  tamsulosin (FLOMAX) 0.4 MG CAPS capsule, TAKE 1 CAPSULE BY MOUTH EVERY DAY, Disp: 30 capsule, Rfl: 3 .  venlafaxine (EFFEXOR) 75 MG tablet, Take 1 tablet (75 mg total) by mouth 2 (two) times daily., Disp: 90 tablet, Rfl: 1 .  busPIRone (BUSPAR) 7.5 MG tablet, Take 1 tablet (7.5 mg total) by mouth 3 (three) times daily., Disp: 30 tablet, Rfl: 0  No Known Allergies  I personally reviewed active problem list, medication list, allergies, family history, social history, health maintenance, lab results with the patient/caregiver today.   ROS Constitutional: Negative for fever or weight change.  Respiratory: Negative for cough and shortness of breath.   Cardiovascular: Negative for chest pain or palpitations.  Gastrointestinal: Negative for abdominal pain, no bowel changes.  Musculoskeletal: Negative for gait problem or joint swelling.  Skin: Negative for rash.  Neurological: Negative for dizziness or headache.  No other specific complaints in a complete review of systems (except as listed in HPI above).  Objective  Vitals:   10/29/17 1351  Pulse: 92  Resp: 18  Temp: 98.3 F (36.8 C)  TempSrc: Oral  SpO2: 98%  Weight: 196 lb 3.2 oz (89 kg)  Height: 5\' 10"   (1.778 m)   Body mass index is 28.15 kg/m.  Physical Exam Constitutional: Patient appears well-developed and well-nourished. No distress.  HENT: Head: Normocephalic and atraumatic. Neck: Normal range of motion. Neck supple. No JVD present. No thyromegaly present.  Cardiovascular: Normal rate, regular rhythm and normal heart sounds.  No murmur heard. No BLE edema. Pulmonary/Chest: Effort normal and breath sounds normal. No respiratory distress. Musculoskeletal: Normal range of motion, no joint effusions. No gross deformities Neurological: Pt is alert and oriented to person, place, and time. No cranial nerve deficit. Coordination, balance, strength, speech  and gait are normal.  Skin: Skin is warm and dry. No rash noted. No erythema.  Psychiatric: Patient has a normal mood and affect. behavior is normal. Judgment and thought content normal.  No results found for this or any previous visit (from the past 72 hour(s)).   PHQ2/9: Depression screen PHQ 2/9 10/29/2017  Decreased Interest 0  Down, Depressed, Hopeless 0  PHQ - 2 Score 0  Altered sleeping 1  Tired, decreased energy 1  Change in appetite 0  Feeling bad or failure about yourself  1  Trouble concentrating 1  Moving slowly or fidgety/restless 1  Suicidal thoughts 0  PHQ-9 Score 5  Difficult doing work/chores Somewhat difficult   Fall Risk: Fall Risk  10/29/2017  Falls in the past year? No   Assessment & Plan  1. Gross hematuria - Intermittent, followed by urology  2. Nephrolithiasis - Maintain follow up with urology; flomax PRN  3. Pure hypertriglyceridemia - Lipid panel - COMPLETE METABOLIC PANEL WITH GFR - rosuvastatin (CRESTOR) 40 MG tablet; Take 1 tablet (40 mg total) by mouth daily.  Dispense: 30 tablet; Refill: 0 - Discussed risk of Rhabdo with fenofibrate combined with statin therapy.  We will recheck lipids today, and will consider changing medications, possibly adding vascepa based on results. - Reviewed  signs and symptoms of pancreatitis.  4. Generalized anxiety disorder - busPIRone (BUSPAR) 7.5 MG tablet; Take 1 tablet (7.5 mg total) by mouth 3 (three) times daily.  Dispense: 30 tablet; Refill: 0 - venlafaxine (EFFEXOR) 75 MG tablet; Take 1 tablet (75 mg total) by mouth 2 (two) times daily.  Dispense: 90 tablet; Refill: 1  5. Need for Tdap vaccination - Tdap vaccine greater than or equal to 7yo IM  6. Overweight (BMI 25.0-29.9) - Discussed importance of 150 minutes of physical activity weekly, eat two servings of fish weekly, eat one serving of tree nuts ( cashews, pistachios, pecans, almonds.Marland Kitchen) every other day, eat 6 servings of fruit/vegetables daily and drink plenty of water and avoid sweet beverages.  - Lipid panel - COMPLETE METABOLIC PANEL WITH GFR  7. Fatigue, unspecified type - COMPLETE METABOLIC PANEL WITH GFR - Vitamin D (25 hydroxy) - Night shift likely contributing, declines TSH today.  8. Screening for HIV without presence of risk factors - HIV Antibody (routine testing w rflx)  9. Need for hepatitis C screening test - Hepatitis C antibody  10. Encounter to establish care

## 2017-10-30 LAB — COMPLETE METABOLIC PANEL WITH GFR
AG Ratio: 1.6 (calc) (ref 1.0–2.5)
ALT: 48 U/L — AB (ref 9–46)
AST: 38 U/L (ref 10–40)
Albumin: 4.9 g/dL (ref 3.6–5.1)
Alkaline phosphatase (APISO): 66 U/L (ref 40–115)
BILIRUBIN TOTAL: 0.6 mg/dL (ref 0.2–1.2)
BUN: 15 mg/dL (ref 7–25)
CO2: 27 mmol/L (ref 20–32)
Calcium: 10.3 mg/dL (ref 8.6–10.3)
Chloride: 100 mmol/L (ref 98–110)
Creat: 0.98 mg/dL (ref 0.60–1.35)
GFR, EST NON AFRICAN AMERICAN: 104 mL/min/{1.73_m2} (ref >=60)
GFR, Est African American: 120 mL/min/{1.73_m2} (ref >=60)
GLOBULIN: 3 g/dL (ref 1.9–3.7)
Glucose, Bld: 83 mg/dL (ref 65–139)
Potassium: 3.9 mmol/L (ref 3.5–5.3)
Sodium: 136 mmol/L (ref 135–146)
Total Protein: 7.9 g/dL (ref 6.1–8.1)

## 2017-10-30 LAB — LIPID PANEL
CHOL/HDL RATIO: 5.2 (calc) — AB (ref <5.0)
CHOLESTEROL: 176 mg/dL (ref <200)
HDL: 34 mg/dL — ABNORMAL LOW (ref >40)
LDL Cholesterol (Calc): 102 mg/dL (calc) — ABNORMAL HIGH
Non-HDL Cholesterol (Calc): 142 mg/dL (calc) — ABNORMAL HIGH (ref <130)
TRIGLYCERIDES: 278 mg/dL — AB (ref <150)

## 2017-10-30 LAB — HEPATITIS C ANTIBODY
HEP C AB: NONREACTIVE
SIGNAL TO CUT-OFF: 0.02 (ref <1.00)

## 2017-10-30 LAB — HIV ANTIBODY (ROUTINE TESTING W REFLEX): HIV 1&2 Ab, 4th Generation: NONREACTIVE

## 2017-11-11 ENCOUNTER — Ambulatory Visit: Payer: Managed Care, Other (non HMO) | Admitting: Physician Assistant

## 2018-01-29 ENCOUNTER — Encounter: Payer: Self-pay | Admitting: Family Medicine

## 2018-01-29 ENCOUNTER — Ambulatory Visit (INDEPENDENT_AMBULATORY_CARE_PROVIDER_SITE_OTHER): Payer: Managed Care, Other (non HMO) | Admitting: Family Medicine

## 2018-01-29 VITALS — BP 100/82 | HR 95 | Temp 98.1°F | Resp 12 | Ht 69.0 in | Wt 195.6 lb

## 2018-01-29 DIAGNOSIS — F411 Generalized anxiety disorder: Secondary | ICD-10-CM

## 2018-01-29 DIAGNOSIS — E663 Overweight: Secondary | ICD-10-CM | POA: Diagnosis not present

## 2018-01-29 DIAGNOSIS — B372 Candidiasis of skin and nail: Secondary | ICD-10-CM

## 2018-01-29 DIAGNOSIS — Z0000 Encounter for general adult medical examination without abnormal findings: Secondary | ICD-10-CM

## 2018-01-29 DIAGNOSIS — E781 Pure hyperglyceridemia: Secondary | ICD-10-CM | POA: Diagnosis not present

## 2018-01-29 MED ORDER — VENLAFAXINE HCL ER 75 MG PO CP24: ORAL_CAPSULE | ORAL | 0 refills | Status: DC

## 2018-01-29 MED ORDER — KETOCONAZOLE 2 % EX CREA: 1.0000 "application " | TOPICAL_CREAM | Freq: Every day | CUTANEOUS | 0 refills | Status: DC

## 2018-01-29 MED ORDER — ROSUVASTATIN CALCIUM 40 MG PO TABS: ORAL_TABLET | ORAL | 1 refills | Status: DC

## 2018-01-29 NOTE — Patient Instructions (Signed)
Preventive Care 18-39 Years, Male Preventive care refers to lifestyle choices and visits with your health care provider that can promote health and wellness. What does preventive care include?   A yearly physical exam. This is also called an annual well check.  Dental exams once or twice a year.  Routine eye exams. Ask your health care provider how often you should have your eyes checked.  Personal lifestyle choices, including: ? Daily care of your teeth and gums. ? Regular physical activity. ? Eating a healthy diet. ? Avoiding tobacco and drug use. ? Limiting alcohol use. ? Practicing safe sex. What happens during an annual well check? The services and screenings done by your health care provider during your annual well check will depend on your age, overall health, lifestyle risk factors, and family history of disease. Counseling Your health care provider may ask you questions about your:  Alcohol use.  Tobacco use.  Drug use.  Emotional well-being.  Home and relationship well-being.  Sexual activity.  Eating habits.  Work and work environment. Screening You may have the following tests or measurements:  Height, weight, and BMI.  Blood pressure.  Lipid and cholesterol levels. These may be checked every 5 years starting at age 20.  Diabetes screening. This is done by checking your blood sugar (glucose) after you have not eaten for a while (fasting).  Skin check.  Hepatitis C blood test.  Hepatitis B blood test.  Sexually transmitted disease (STD) testing. Discuss your test results, treatment options, and if necessary, the need for more tests with your health care provider. Vaccines Your health care provider may recommend certain vaccines, such as:  Influenza vaccine. This is recommended every year.  Tetanus, diphtheria, and acellular pertussis (Tdap, Td) vaccine. You may need a Td booster every 10 years.  Varicella vaccine. You may need this if you  have not been vaccinated.  HPV vaccine. If you are 26 or younger, you may need three doses over 6 months.  Measles, mumps, and rubella (MMR) vaccine. You may need at least one dose of MMR.You may also need a second dose.  Pneumococcal 13-valent conjugate (PCV13) vaccine. You may need this if you have certain conditions and have not been vaccinated.  Pneumococcal polysaccharide (PPSV23) vaccine. You may need one or two doses if you smoke cigarettes or if you have certain conditions.  Meningococcal vaccine. One dose is recommended if you are age 19-21 years and a first-year college student living in a residence hall, or if you have one of several medical conditions. You may also need additional booster doses.  Hepatitis A vaccine. You may need this if you have certain conditions or if you travel or work in places where you may be exposed to hepatitis A.  Hepatitis B vaccine. You may need this if you have certain conditions or if you travel or work in places where you may be exposed to hepatitis B.  Haemophilus influenzae type b (Hib) vaccine. You may need this if you have certain risk factors. Talk to your health care provider about which screenings and vaccines you need and how often you need them. This information is not intended to replace advice given to you by your health care provider. Make sure you discuss any questions you have with your health care provider. Document Released: 03/11/2001 Document Revised: 08/26/2016 Document Reviewed: 11/14/2014 Elsevier Interactive Patient Education  2019 Elsevier Inc.   

## 2018-01-29 NOTE — Progress Notes (Signed)
Name: Adam Palmer   MRN: [id number]    DOB: Jun 19, 1990   Date:01/29/2018       Progress Note  Subjective  Chief Complaint  Chief Complaint  Patient presents with  . Annual Exam    HPI  Patient presents for annual CPE.  USPSTF grade A and B recommendations:  Diet: Eating more salads. Avoiding fried/fatty foods.  Exercise: He is exercising - walking >3-4 times a week.  Depression: He states he is having trouble remembering to take second dose of Effexor - would like to go on XR dosing, he was on 150mg  XR in the past and this worked well for him.  We will switch today.  PHQ-9 is elevated a bit today - he is working 4 nights on, 3 off right now. He sleeps about 8-9 hours during the day.  Depression screen [facility] 2/9 01/29/2018 10/29/2017  Decreased Interest 0 0  Down, Depressed, Hopeless 1 0  PHQ - 2 Score 1 0  Altered sleeping 2 1  Tired, decreased energy 3 1  Change in appetite 0 0  Feeling bad or failure about yourself  0 1  Trouble concentrating 0 1  Moving slowly or fidgety/restless 1 1  Suicidal thoughts 0 0  PHQ-9 Score 7 5  Difficult doing work/chores Not difficult at all Somewhat difficult   Hypertension:  BP Readings from Last 3 Encounters:  01/29/18 100/82  10/29/17 102/68  04/29/17 124/79   Obesity: Wt Readings from Last 3 Encounters:  01/29/18 195 lb 9.6 oz (88.7 kg)  10/29/17 196 lb 3.2 oz (89 kg)  04/29/17 185 lb (83.9 kg)   BMI Readings from Last 3 Encounters:  01/29/18 28.89 kg/m  10/29/17 28.15 kg/m  04/29/17 25.80 kg/m   Lipids: Taking crestor about 2 days a week.  Lab Results  Component Value Date   CHOL 176 10/29/2017   Lab Results  Component Value Date   HDL 34 (L) 10/29/2017   Lab Results  Component Value Date   LDLCALC 102 (H) 10/29/2017   Lab Results  Component Value Date   TRIG 278 (H) 10/29/2017   Lab Results  Component Value Date   CHOLHDL 5.2 (H) 10/29/2017   No results found for: LDLDIRECT Glucose:  Glucose   Date Value Ref Range Status  08/28/2013 97 65 - 99 mg/dL Final  41/42/3953 202 (H) 65 - 99 mg/dL Final   Glucose, Bld  Date Value Ref Range Status  10/29/2017 83 65 - 139 mg/dL Final    Comment:    .        Non-fasting reference interval .   03/20/2017 114 (H) 65 - 99 mg/dL Final  33/43/5686 92 65 - 99 mg/dL Final   Occasionally consumes ETOH   Office Visit from 10/29/2017 in [facility]  AUDIT-C Score  0     Married, no new partners this year. STD testing and prevention (HIV/chl/gon/syphilis): Negative HIV in October 2019, declines other testing Hep C: Negative in [place]tober 2019  Skin cancer: No concerning lesions; does have itchy rash on his abdomen right at the area that his pants touch his skin Colorectal cancer: Denies family or personal history of colorectal cancer, no changes in BM's - no blood in stool, dark and tarry stool, mucus in stool, or constipation/diarrhea. Prostate cancer: No family history; is seeing urology periodically for nephrolithiasis and intermittent hematuria.  Lung cancer: Low Dose CT Chest recommended if Age 7-80 years, 30 pack-year currently smoking OR have quit w/in  15years. Patient does not qualify.   AAA: Not indicated; The USPSTF recommends one-time screening with ultrasonography in men ages 765 to 8175 years who have ever smoked ECG:  No chest pain, shortness of breath, or palpitations.  Advanced Care Planning: A voluntary discussion about advance care planning including the explanation and discussion of advance directives.  Discussed health care proxy and Living will, and the patient was able to identify a health care proxy as Eusebio Measey Wiemann.  Patient does not have a living will at present time. If patient does have living will, I have requested they bring this to the clinic to be scanned in to their chart.  Patient Active Problem List   Diagnosis Date Noted  . Pure hypertriglyceridemia 10/29/2017  . Generalized anxiety disorder  10/29/2017  . Overweight (BMI 25.0-29.9) 10/29/2017  . Fatigue 10/29/2017  . Gross hematuria 04/29/2017  . Nephrolithiasis 04/29/2017    Past Surgical History:  Procedure Laterality Date  . APPENDECTOMY      Family History  Problem Relation Age of Onset  . Anxiety disorder Mother   . Anxiety disorder Father   . Kidney disease Neg Hx   . Prostate cancer Neg Hx   . Bladder Cancer Neg Hx   . Kidney cancer Neg Hx     Social History   Socioeconomic History  . Marital status: Married    Spouse name: Jonette EvaKacey  . Number of children: 3  . Years of education: Not on file  . Highest education level: Not on file  Occupational History  . Not on file  Social Needs  . Financial resource strain: Not hard at all  . Food insecurity:    Worry: Never true    Inability: Never true  . Transportation needs:    Medical: No    Non-medical: No  Tobacco Use  . Smoking status: Former Games developermoker  . Smokeless tobacco: Current User    Types: Chew  . Tobacco comment: quit 9 years  Substance and Sexual Activity  . Alcohol use: Yes  . Drug use: No  . Sexual activity: Yes    Partners: Female  Lifestyle  . Physical activity:    Days per week: 4 days    Minutes per session: 60 min  . Stress: To some extent  Relationships  . Social connections:    Talks on phone: More than three times a week    Gets together: Once a week    Attends religious service: Never    Active member of club or organization: No    Attends meetings of clubs or organizations: Never    Relationship status: Married  . Intimate partner violence:    Fear of current or ex partner: No    Emotionally abused: No    Physically abused: No    Forced sexual activity: No  Other Topics Concern  . Not on file  Social History Narrative  . Not on file    Current Outpatient Medications:  .  busPIRone (BUSPAR) 7.5 MG tablet, Take 1 tablet (7.5 mg total) by mouth 3 (three) times daily., Disp: 30 tablet, Rfl: 0 .  rosuvastatin  (CRESTOR) 40 MG tablet, Take 1 tablet (40 mg total) by mouth daily., Disp: 30 tablet, Rfl: 0 .  tamsulosin (FLOMAX) 0.4 MG CAPS capsule, TAKE 1 CAPSULE BY MOUTH EVERY DAY, Disp: 30 capsule, Rfl: 3 .  fenofibrate 160 MG tablet, Take 160 mg by mouth daily., Disp: , Rfl:   No Known Allergies   ROS  Constitutional:  Negative for fever or weight change.  Respiratory: Negative for cough and shortness of breath.   Cardiovascular: Negative for chest pain or palpitations.  Gastrointestinal: Negative for abdominal pain, no bowel changes.  Musculoskeletal: Negative for gait problem or joint swelling.  Skin: Negative for rash.  Neurological: Negative for dizziness or headache.  No other specific complaints in a complete review of systems (except as listed in HPI above).  Objective  Vitals:   01/29/18 0818  BP: 100/82  Pulse: 95  Resp: 12  Temp: 98.1 F (36.7 C)  TempSrc: Oral  SpO2: 97%  Weight: 195 lb 9.6 oz (88.7 kg)  Height: 5\' 9"  (1.753 m)    Body mass index is 28.89 kg/m.  Physical Exam Constitutional: Patient appears well-developed and well-nourished. No distress.  HENT: Head: Normocephalic and atraumatic. Ears: B TMs ok, no erythema or effusion; Nose: Nose normal. Mouth/Throat: Oropharynx is clear and moist. No oropharyngeal exudate.  Eyes: Conjunctivae and EOM are normal. Pupils are equal, round, and reactive to light. No scleral icterus.  Neck: Normal range of motion. Neck supple. No JVD present. No thyromegaly present.  Cardiovascular: Normal rate, regular rhythm and normal heart sounds.  No murmur heard. No BLE edema. Pulmonary/Chest: Effort normal and breath sounds normal. No respiratory distress. Abdominal: Soft. Bowel sounds are normal, no distension. There is no tenderness. no masses MALE GENITALIA: Deferred RECTAL: Deferred Musculoskeletal: Normal range of motion, no joint effusions. No gross deformities Neurological: he is alert and oriented to person, place, and  time. No cranial nerve deficit. Coordination, balance, strength, speech and gait are normal.  Skin: Skin is warm and dry. No rash noted. No erythema.  Psychiatric: Patient has a normal mood and affect. behavior is normal. Judgment and thought content normal.  No results found for this or any previous visit (from the past 2160 hour(s)).  PHQ2/9: Depression screen [facility] 2/9 01/29/2018 10/29/2017  Decreased Interest 0 0  Down, Depressed, Hopeless 1 0  PHQ - 2 Score 1 0  Altered sleeping 2 1  Tired, decreased energy 3 1  Change in appetite 0 0  Feeling bad or failure about yourself  0 1  Trouble concentrating 0 1  Moving slowly or fidgety/restless 1 1  Suicidal thoughts 0 0  PHQ-9 Score 7 5  Difficult doing work/chores Not difficult at all Somewhat difficult   Fall Risk: Fall Risk  10/29/2017  Falls in the past year? No   Assessment & Plan   1. Annual physical exam -Prostate cancer screening and PSA options (with potential risks and benefits of testing vs not testing) were discussed along with recent recs/guidelines. -USPSTF grade A and B recommendations reviewed with patient; age-appropriate recommendations, preventive care, screening tests, etc discussed and encouraged; healthy living encouraged; see AVS for patient education given to patient -Discussed importance of 150 minutes of physical activity weekly, eat two servings of fish weekly, eat one serving of tree nuts ( cashews, pistachios, pecans, almonds.Marland Kitchen) every other day, eat 6 servings of fruit/vegetables daily and drink plenty of water and avoid sweet beverages.   2. Pure hypertriglyceridemia - rosuvastatin (CRESTOR) 40 MG tablet; Take 1 tablet by mouth 3 days a week.  Dispense: 30 tablet; Refill: 1  3. Overweight (BMI 25.0-29.9) - See above for teaching  4. Generalized anxiety disorder - venlafaxine XR (EFFEXOR XR) 75 MG 24 hr capsule; Take 1 tablet by mouth daily x7 days, then increase to 2 tablets by mouth daily.  Dispense:  180 capsule; Refill: 0  5.  Candidal intertrigo - nystatin cream (MYCOSTATIN); Apply 1 application topically 2 (two) times daily.  Dispense: 30 g; Refill: 0

## 2018-02-22 ENCOUNTER — Ambulatory Visit (INDEPENDENT_AMBULATORY_CARE_PROVIDER_SITE_OTHER): Payer: Managed Care, Other (non HMO) | Admitting: Nurse Practitioner

## 2018-02-22 ENCOUNTER — Encounter: Payer: Self-pay | Admitting: Nurse Practitioner

## 2018-02-22 VITALS — BP 116/88 | HR 97 | Temp 99.7°F | Resp 18 | Ht 69.0 in | Wt 197.8 lb

## 2018-02-22 DIAGNOSIS — J101 Influenza due to other identified influenza virus with other respiratory manifestations: Secondary | ICD-10-CM | POA: Diagnosis not present

## 2018-02-22 DIAGNOSIS — R05 Cough: Secondary | ICD-10-CM

## 2018-02-22 DIAGNOSIS — R059 Cough, unspecified: Secondary | ICD-10-CM

## 2018-02-22 DIAGNOSIS — R6889 Other general symptoms and signs: Secondary | ICD-10-CM

## 2018-02-22 DIAGNOSIS — J029 Acute pharyngitis, unspecified: Secondary | ICD-10-CM | POA: Diagnosis not present

## 2018-02-22 LAB — POCT INFLUENZA A/B
INFLUENZA A, POC: NEGATIVE
INFLUENZA B, POC: POSITIVE — AB

## 2018-02-22 LAB — POCT RAPID STREP A (OFFICE): RAPID STREP A SCREEN: NEGATIVE

## 2018-02-22 MED ORDER — BENZONATATE 200 MG PO CAPS: 200.0000 mg | ORAL_CAPSULE | Freq: Two times a day (BID) | ORAL | 0 refills | Status: DC | PRN

## 2018-02-22 NOTE — Patient Instructions (Addendum)
Influenza, Adult Influenza, more commonly known as "the flu," is a viral infection that mainly affects the respiratory tract. The respiratory tract includes organs that help you breathe, such as the lungs, nose, and throat. The flu causes many symptoms similar to the common cold along with high fever and body aches. The flu spreads easily from person to person (is contagious). Getting a flu shot (influenza vaccination) every year is the best way to prevent the flu. What are the causes? This condition is caused by the influenza virus. You can get the virus by:  Breathing in droplets that are in the air from an infected person's cough or sneeze.  Touching something that has been exposed to the virus (has been contaminated) and then touching your mouth, nose, or eyes. What increases the risk? The following factors may make you more likely to get the flu:  Not washing or sanitizing your hands often.  Having close contact with many people during cold and flu season.  Touching your mouth, eyes, or nose without first washing or sanitizing your hands.  Not getting a yearly (annual) flu shot. You may have a higher risk for the flu, including serious problems such as a lung infection (pneumonia), if you:  Are older than 65.  Are pregnant.  Have a weakened disease-fighting system (immune system). You may have a weakened immune system if you: ? Have HIV or AIDS. ? Are undergoing chemotherapy. ? Are taking medicines that reduce (suppress) the activity of your immune system.  Have a long-term (chronic) illness, such as heart disease, kidney disease, diabetes, or lung disease.  Have a liver disorder.  Are severely overweight (morbidly obese).  Have anemia. This is a condition that affects your red blood cells.  Have asthma. What are the signs or symptoms? Symptoms of this condition usually begin suddenly and last 4-14 days. They may include:  Fever and chills.  Headaches, body aches, or  muscle aches.  Sore throat.  Cough.  Runny or stuffy (congested) nose.  Chest discomfort.  Poor appetite.  Weakness or fatigue.  Dizziness.  Nausea or vomiting. How is this diagnosed? This condition may be diagnosed based on:  Your symptoms and medical history.  A physical exam.  Swabbing your nose or throat and testing the fluid for the influenza virus. How is this treated? If the flu is diagnosed early, you can be treated with medicine that can help reduce how severe the illness is and how long it lasts (antiviral medicine). This may be given by mouth (orally) or through an IV. Taking care of yourself at home can help relieve symptoms. Your health care provider may recommend:  Taking over-the-counter medicines.  Drinking plenty of fluids. In many cases, the flu goes away on its own. If you have severe symptoms or complications, you may be treated in a hospital. Follow these instructions at home: Activity  Rest as needed and get plenty of sleep.  Stay home from work or school as told by your health care provider. Unless you are visiting your health care provider, avoid leaving home until your fever has been gone for 24 hours without taking medicine. Eating and drinking  Take an oral rehydration solution (ORS). This is a drink that is sold at pharmacies and retail stores.  Drink enough fluid to keep your urine pale yellow.  Drink clear fluids in small amounts as you are able. Clear fluids include water, ice chips, diluted fruit juice, and low-calorie sports drinks.  Eat bland, easy-to-digest foods   in small amounts as you are able. These foods include bananas, applesauce, rice, lean meats, toast, and crackers.  Avoid drinking fluids that contain a lot of sugar or caffeine, such as energy drinks, regular sports drinks, and soda.  Avoid alcohol.  Avoid spicy or fatty foods. General instructions For Fever/Pain: Acetaminophen every 6 hours as needed (maximum of  3000mg  a day). If you are still uncomfortable you can add ibuprofen OR naproxen  For coughing: try dextromethorphan for a cough suppressant, and/or a cool mist humidifier, lozenges  For sore throat: saline gargles, honey herbal tea, lozenges, throat spray  To dry out your nose: try an antihistamine like loratadine (non-sedating) or diphenhydramine (sedating) or others To relieve a stuffy nose: try an oral decongestant  Like pseudoephedrine if you are under the age of 60 and do not have high blood pressure, neti pot To make blowing your nose easier and relieve chest congestion: guaifenesin 400mg  every 4-6 hours of guaifenesin ER [phone] mg every 12 hours. Do not take more than 2,400mg  a day.    Take over-the-counter and prescription medicines only as told by your health care provider.  Use a cool mist humidifier to add humidity to the air in your home. This can make it easier to breathe.  Cover your mouth and nose when you cough or sneeze.  Wash your hands with soap and water often, especially after you cough or sneeze. If soap and water are not available, use alcohol-based hand sanitizer.  Keep all follow-up visits as told by your health care provider. This is important. How is this prevented?  Get an annual flu shot. You may get the flu shot in late summer, fall, or winter. Ask your health care provider when you should get your flu shot.  Avoid contact with people who are sick during cold and flu season. This is generally fall and winter. Contact a health care provider if:  You develop new symptoms.  You have: ? Chest pain. ? Diarrhea. ? A fever.  Your cough gets worse.  You produce more mucus.  You feel nauseous or you vomit. Get help right away if:  You develop shortness of breath or difficulty breathing.  Your skin or nails turn a bluish color.  You have severe pain or stiffness in your neck.  You develop a sudden headache or sudden pain in your face or ear.  You  cannot eat or drink without vomiting. Summary  Influenza, more commonly known as "the flu," is a viral infection that primarily affects your respiratory tract.  Symptoms of the flu usually begin suddenly and last 4-14 days.  Getting an annual flu shot is the best way to prevent getting the flu.  Stay home from work or school as told by your health care provider. Unless you are visiting your health care provider, avoid leaving home until your fever has been gone for 24 hours without taking medicine.  Keep all follow-up visits as told by your health care provider. This is important. This information is not intended to replace advice given to you by your health care provider. Make sure you discuss any questions you have with your health care provider. Document Released: 01/11/2000 Document Revised: 07/01/2017 Document Reviewed: 07/01/2017 Elsevier Interactive Patient Education  2019 [employer].

## 2018-02-22 NOTE — Progress Notes (Signed)
Name: Adam Palmer   MRN: [id number]    DOB: November 17, 1990   Date:02/22/2018       Progress Note  Subjective  Chief Complaint  Chief Complaint  Patient presents with  . Sore Throat    both sides - tonsils are very much swollen  . Generalized Body Aches  . Fever  . Cough    dry - pressure when he coughs  . Headache    about all last week    HPI  Patient endorses headache last week. Notes fever, cough, generalized body aches and sore throat. Taking ibuprofen as needed for body aches. Painful to swallow.   Denies shortness of breath, facial pain, rash.   Patient Active Problem List   Diagnosis Date Noted  . Pure hypertriglyceridemia 10/29/2017  . Generalized anxiety disorder 10/29/2017  . Overweight (BMI 25.0-29.9) 10/29/2017  . Fatigue 10/29/2017  . Gross hematuria 04/29/2017  . Nephrolithiasis 04/29/2017    Past Medical History:  Diagnosis Date  . Anxiety   . Kidney calculi     Past Surgical History:  Procedure Laterality Date  . APPENDECTOMY      Social History   Tobacco Use  . Smoking status: Former Smoker    Packs/day: 0.50    Years: 4.00    Pack years: 2.00  . Smokeless tobacco: Current User    Types: Chew  . Tobacco comment: quit 9 years  Substance Use Topics  . Alcohol use: Yes     Current Outpatient Medications:  .  busPIRone (BUSPAR) 7.5 MG tablet, Take 1 tablet (7.5 mg total) by mouth 3 (three) times daily., Disp: 30 tablet, Rfl: 0 .  ketoconazole (NIZORAL) 2 % cream, Apply 1 application topically daily., Disp: 15 g, Rfl: 0 .  tamsulosin (FLOMAX) 0.4 MG CAPS capsule, TAKE 1 CAPSULE BY MOUTH EVERY DAY, Disp: 30 capsule, Rfl: 3 .  venlafaxine XR (EFFEXOR XR) 75 MG 24 hr capsule, Take 1 tablet by mouth daily x7 days, then increase to 2 tablets by mouth daily., Disp: 180 capsule, Rfl: 0 .  fenofibrate 160 MG tablet, Take 160 mg by mouth daily., Disp: , Rfl:  .  rosuvastatin (CRESTOR) 40 MG tablet, Take 1 tablet by mouth 3 days a week.  (Patient not taking: Reported on 02/22/2018), Disp: 30 tablet, Rfl: 1  No Known Allergies  ROS   No other specific complaints in a complete review of systems (except as listed in HPI above).  Objective  Vitals:   02/22/18 1418  BP: 116/88  Pulse: 97  Resp: 18  Temp: 99.7 F (37.6 C)  TempSrc: Oral  SpO2: 96%  Weight: 197 lb 12.8 oz (89.7 kg)  Height: 5\' 9"  (1.753 m)    Body mass index is 29.21 kg/m.  Nursing Note and Vital Signs reviewed.  Physical Exam HENT:     Head: Normocephalic and atraumatic.     Right Ear: Hearing, tympanic membrane, ear canal and external ear normal.     Left Ear: Hearing, tympanic membrane, ear canal and external ear normal.     Nose: Congestion and rhinorrhea present.     Right Sinus: No maxillary sinus tenderness or frontal sinus tenderness.     Left Sinus: No maxillary sinus tenderness or frontal sinus tenderness.     Mouth/Throat:     Mouth: Mucous membranes are moist.     Pharynx: Uvula midline. Pharyngeal swelling and posterior oropharyngeal erythema present. No oropharyngeal exudate or uvula swelling.     Tonsils: No tonsillar exudate  or tonsillar abscesses. Swelling: 2+ on the right. 3+ on the left.  Eyes:     General:        Right eye: No discharge.        Left eye: No discharge.     Conjunctiva/sclera: Conjunctivae normal.  Neck:     Musculoskeletal: Normal range of motion.  Cardiovascular:     Rate and Rhythm: Tachycardia present.  Pulmonary:     Effort: Pulmonary effort is normal.     Breath sounds: Normal breath sounds.  Lymphadenopathy:     Cervical: No cervical adenopathy.  Skin:    General: Skin is warm and dry.     Findings: No rash.  Neurological:     General: No focal deficit present.     Mental Status: He is alert and oriented to person, place, and time.  Psychiatric:        Mood and Affect: Mood normal.        Behavior: Behavior normal.        Judgment: Judgment normal.        Results for orders  placed or performed in visit on 02/22/18 (from the past 48 hour(s))  POCT Influenza A/B     Status: Abnormal   Collection Time: 02/22/18  2:28 PM  Result Value Ref Range   Influenza A, POC Negative Negative   Influenza B, POC Positive (A) Negative  POCT rapid strep A     Status: Normal   Collection Time: 02/22/18  2:28 PM  Result Value Ref Range   Rapid Strep A Screen Negative Negative    Assessment & Plan  1. Flu-like symptoms - POCT Influenza A/B  2. Sore throat - POCT rapid strep A  3. Cough - benzonatate (TESSALON) 200 MG capsule; Take 1 capsule (200 mg total) by mouth 2 (two) times daily as needed for cough.  Dispense: 30 capsule; Refill: 0  4. Influenza B Symptoms ongoing for 4-5 days discussed course of ilness and otc treatment and prevention of complications.

## 2018-02-23 ENCOUNTER — Telehealth: Payer: Self-pay | Admitting: Family Medicine

## 2018-02-23 ENCOUNTER — Other Ambulatory Visit: Payer: Self-pay | Admitting: Nurse Practitioner

## 2018-02-23 MED ORDER — MAGIC MOUTHWASH W/LIDOCAINE: 5.0000 mL | Freq: Three times a day (TID) | ORAL | 0 refills | Status: DC | PRN

## 2018-02-23 NOTE — Telephone Encounter (Signed)
Copied from CRM [id number]. Topic: Quick Communication - Rx Refill/Question >> Feb 23, 2018 11:53 AM Crist Infante wrote: Medication: something for his throat/ease the pain Pt saw [facility] yesterday.  Positive for the flu. Wife calling to advise pt is worse today, fever has increased to 102. Pt's sore throat is worse. Pt is requesting a med to ease his throat. It is hurting so badly, and more swollen than yesterday. She states pt is waking up with body aches and pain. Ok to call her, as he can barely talk.  [id number] [place], [place] - [address] [phone] (Phone) [phone] (Fax)

## 2018-02-23 NOTE — Telephone Encounter (Signed)
Please ensure he is alternating between acetaminophen 1,000mg  every 8 hours and ibuprofen ibuprofen 600mg  every 6 hours. If his fever is still above 101.86F with these medications on board then please go to ER for evaluation. I am sending in magic mouth wash to swish/garggle in mouth for at least 30 seconds and spit. Please fax it over to the appropriate pharmacy.

## 2018-02-23 NOTE — Telephone Encounter (Signed)
Called patient and reviewed message. Patient expressed verbal understanding and said thanks.

## 2018-02-23 NOTE — Telephone Encounter (Signed)
Please advise 

## 2018-02-25 ENCOUNTER — Other Ambulatory Visit: Payer: Self-pay

## 2018-02-25 ENCOUNTER — Encounter: Payer: Self-pay | Admitting: Emergency Medicine

## 2018-02-25 ENCOUNTER — Emergency Department: Payer: Managed Care, Other (non HMO)

## 2018-02-25 ENCOUNTER — Emergency Department
Admission: EM | Admit: 2018-02-25 | Discharge: 2018-02-25 | Disposition: A | Discharge status: Home / Self Care | Payer: Managed Care, Other (non HMO) | Attending: Emergency Medicine | Admitting: Emergency Medicine

## 2018-02-25 DIAGNOSIS — Z79899 Other long term (current) drug therapy: Secondary | ICD-10-CM | POA: Insufficient documentation

## 2018-02-25 DIAGNOSIS — F17228 Nicotine dependence, chewing tobacco, with other nicotine-induced disorders: Secondary | ICD-10-CM | POA: Diagnosis not present

## 2018-02-25 DIAGNOSIS — J111 Influenza due to unidentified influenza virus with other respiratory manifestations: Secondary | ICD-10-CM | POA: Diagnosis not present

## 2018-02-25 DIAGNOSIS — R05 Cough: Secondary | ICD-10-CM | POA: Diagnosis present

## 2018-02-25 DIAGNOSIS — R070 Pain in throat: Secondary | ICD-10-CM | POA: Diagnosis not present

## 2018-02-25 LAB — GROUP A STREP BY PCR: Group A Strep by PCR: NOT DETECTED

## 2018-02-25 MED ORDER — HYDROCOD POLST-CPM POLST ER 10-8 MG/5ML PO SUER: 5.0000 mL | Freq: Two times a day (BID) | ORAL | 0 refills | Status: DC | PRN

## 2018-02-25 MED ORDER — HYDROCOD POLST-CPM POLST ER 10-8 MG/5ML PO SUER
5.0000 mL | Freq: Once | ORAL | Status: AC
  Administered 2018-02-25: 5 mL via ORAL
  Filled 2018-02-25: qty 5

## 2018-02-25 NOTE — ED Triage Notes (Addendum)
Patient ambulatory to triage with steady gait, without difficulty or distress noted, mask in place; dx with influenza on Monday; now with c/o sore throat and feels as if he has strep throat

## 2018-02-25 NOTE — ED Provider Notes (Signed)
[facility] Emergency Department Provider Note   First MD Initiated Contact with Patient 02/25/18 0321     (approximate)  I have reviewed the triage vital signs and the nursing notes.   HISTORY  Chief Complaint Sore Throat    HPI Adam Palmer is a 28 y.o. male with below list of chronic medical conditions presents to the emergency department recently diagnosed influenza 3 days ago.  Patient presents today with continued and worsening cough as well as sore throat.  Patient concern for the possibility of strep pharyngitis and that is consistent with when he had strep throat in the past.  Patient afebrile on presentation temperature 99.9.  Patient states that he was prescribed Tessalon Perles for cough which has not improved his cough.   Past Medical History:  Diagnosis Date  . Anxiety   . Kidney calculi     Patient Active Problem List   Diagnosis Date Noted  . Pure hypertriglyceridemia 10/29/2017  . Generalized anxiety disorder 10/29/2017  . Overweight (BMI 25.0-29.9) 10/29/2017  . Fatigue 10/29/2017  . Gross hematuria 04/29/2017  . Nephrolithiasis 04/29/2017    Past Surgical History:  Procedure Laterality Date  . APPENDECTOMY      Prior to Admission medications   Medication Sig Start Date End Date Taking? Authorizing Provider  benzonatate (TESSALON) 200 MG capsule Take 1 capsule (200 mg total) by mouth 2 (two) times daily as needed for cough. 02/22/18   Poulose, Percell BeltElizabeth E, NP  busPIRone (BUSPAR) 7.5 MG tablet Take 1 tablet (7.5 mg total) by mouth 3 (three) times daily. 10/29/17   Doren CustardBoyce, [name] E, FNP  fenofibrate 160 MG tablet Take 160 mg by mouth daily.    [provider]  ketoconazole (NIZORAL) 2 % cream Apply 1 application topically daily. 01/29/18   Doren CustardBoyce, [name] E, FNP  magic mouthwash w/lidocaine SOLN Take 5 mLs by mouth 3 (three) times daily as needed for mouth pain. 1 Part viscous lidocaine 2% 1 Part Maalox (do not  substitute Kaopectate) 1 Part diphenhydramine 12.5 mg per 5 ml elixir. Or can substitute for pharmacy protocol. 02/23/18   Poulose, Percell BeltElizabeth E, NP  rosuvastatin (CRESTOR) 40 MG tablet Take 1 tablet by mouth 3 days a week. Patient not taking: Reported on 02/22/2018 01/29/18   Doren CustardBoyce, [name] E, FNP  tamsulosin (FLOMAX) 0.4 MG CAPS capsule TAKE 1 CAPSULE BY MOUTH EVERY DAY 05/06/17   Stoioff, Verna CzechScott C, MD  venlafaxine XR (EFFEXOR XR) 75 MG 24 hr capsule Take 1 tablet by mouth daily x7 days, then increase to 2 tablets by mouth daily. 01/29/18   Doren CustardBoyce, [name] E, FNP    Allergies Patient has no known allergies.  Family History  Problem Relation Age of Onset  . Anxiety disorder Mother   . Anxiety disorder Father   . Kidney disease Neg Hx   . Prostate cancer Neg Hx   . Bladder Cancer Neg Hx   . Kidney cancer Neg Hx     Social History Social History   Tobacco Use  . Smoking status: Former Smoker    Packs/day: 0.50    Years: 4.00    Pack years: 2.00  . Smokeless tobacco: Current User    Types: Chew  . Tobacco comment: quit 9 years  Substance Use Topics  . Alcohol use: Yes  . Drug use: No    Review of Systems Constitutional: No fever/chills Eyes: No visual changes. ENT: Positive for sore throat. Cardiovascular: Denies chest pain. Respiratory: Denies shortness of  breath.  Positive for cough Gastrointestinal: No abdominal pain.  No nausea, no vomiting.  No diarrhea.  No constipation. Genitourinary: Negative for dysuria. Musculoskeletal: Negative for neck pain.  Negative for back pain. Integumentary: Negative for rash. Neurological: Negative for headaches, focal weakness or numbness.   ____________________________________________   PHYSICAL EXAM:  VITAL SIGNS: ED Triage Vitals  Enc Vitals Group     BP 02/25/18 0122 (!) 146/75     Pulse Rate 02/25/18 0122 (!) 114     Resp 02/25/18 0122 20     Temp 02/25/18 0122 99.9 F (37.7 C)     Temp Source 02/25/18 0122 Oral     SpO2  02/25/18 0122 100 %     Weight 02/25/18 0121 86.2 kg (190 lb)     Height 02/25/18 0121 1.778 m (5\' 10" )     Head Circumference --      Peak Flow --      Pain Score 02/25/18 0121 7     Pain Loc --      Pain Edu? --      Excl. in GC? --     Constitutional: Alert and oriented. Well appearing and in no acute distress. Eyes: Conjunctivae are normal. Head: Atraumatic. Mouth/Throat: Mucous membranes are moist.  Range of erythema no exudate  neck: No stridor.  No cervical lymphadenopathy appreciated. Cardiovascular: Normal rate, regular rhythm. Good peripheral circulation. Grossly normal heart sounds. Respiratory: Normal respiratory effort.  No retractions. Lungs CTAB. Gastrointestinal: Soft and nontender. No distention.  Musculoskeletal: No lower extremity tenderness nor edema. No gross deformities of extremities. Neurologic:  Normal speech and language. No gross focal neurologic deficits are appreciated.  Skin:  Skin is warm, dry and intact. No rash noted.   ____________________________________________   LABS (all labs ordered are listed, but only abnormal results are displayed)  Labs Reviewed  GROUP A STREP BY PCR   _________________________ Procedures   ____________________________________________   INITIAL IMPRESSION / ASSESSMENT AND PLAN / ED COURSE  As part of my medical decision making, I reviewed the following data within the electronic MEDICAL RECORD NUMBER 28 year old male presenting with above-stated history and physical exam concerning for possible strep pharyngitis however rapid strep negative.  Based on center criteria treatment not initiated.  Also considered possibility of pneumonia and as such chest x-ray performed which was also negative.  Patient given Tussionex in the emergency department will be prescribed same for home ____________________________________________  FINAL CLINICAL IMPRESSION(S) / ED DIAGNOSES  Final diagnoses:  Influenza     MEDICATIONS  GIVEN DURING THIS VISIT:  Medications - No data to display   ED Discharge Orders    None       Note:  This document was prepared using Dragon voice recognition software and may include unintentional dictation errors.    Darci CurrentBrown, [place] N, MD 02/25/18 [phone]

## 2018-02-25 NOTE — ED Notes (Signed)
Pt medicated for cough/ pain. Call bell within reach. Awaits XR results. No needs at this time.

## 2018-02-25 NOTE — ED Notes (Signed)
Pt verbalized understanding of d/c instructions, RX, and f/u care. No further questions at this time. Pt ambulatory to the exit with steady gait  

## 2018-02-25 NOTE — ED Notes (Signed)
Patient transported to X-ray 

## 2018-04-25 ENCOUNTER — Other Ambulatory Visit: Payer: Self-pay | Admitting: Family Medicine

## 2018-04-25 DIAGNOSIS — F411 Generalized anxiety disorder: Secondary | ICD-10-CM

## 2018-04-26 ENCOUNTER — Other Ambulatory Visit: Payer: Self-pay | Admitting: Emergency Medicine

## 2018-04-26 DIAGNOSIS — F411 Generalized anxiety disorder: Secondary | ICD-10-CM

## 2018-04-26 NOTE — Telephone Encounter (Signed)
Order sent to [name] for review 

## 2018-04-30 ENCOUNTER — Ambulatory Visit (INDEPENDENT_AMBULATORY_CARE_PROVIDER_SITE_OTHER): Payer: Managed Care, Other (non HMO) | Admitting: Family Medicine

## 2018-04-30 ENCOUNTER — Other Ambulatory Visit: Payer: Self-pay

## 2018-04-30 ENCOUNTER — Encounter: Payer: Self-pay | Admitting: Family Medicine

## 2018-04-30 DIAGNOSIS — Z79899 Other long term (current) drug therapy: Secondary | ICD-10-CM

## 2018-04-30 DIAGNOSIS — E663 Overweight: Secondary | ICD-10-CM | POA: Diagnosis not present

## 2018-04-30 DIAGNOSIS — E781 Pure hyperglyceridemia: Secondary | ICD-10-CM

## 2018-04-30 DIAGNOSIS — N200 Calculus of kidney: Secondary | ICD-10-CM

## 2018-04-30 DIAGNOSIS — Z5181 Encounter for therapeutic drug level monitoring: Secondary | ICD-10-CM

## 2018-04-30 DIAGNOSIS — J4530 Mild persistent asthma, uncomplicated: Secondary | ICD-10-CM

## 2018-04-30 DIAGNOSIS — F411 Generalized anxiety disorder: Secondary | ICD-10-CM | POA: Diagnosis not present

## 2018-04-30 DIAGNOSIS — G4726 Circadian rhythm sleep disorder, shift work type: Secondary | ICD-10-CM | POA: Insufficient documentation

## 2018-04-30 MED ORDER — VENLAFAXINE HCL ER 150 MG PO CP24: 150.0000 mg | ORAL_CAPSULE | Freq: Every day | ORAL | 1 refills | Status: DC

## 2018-04-30 MED ORDER — LEVOCETIRIZINE DIHYDROCHLORIDE 5 MG PO TABS: 5.0000 mg | ORAL_TABLET | Freq: Every evening | ORAL | 1 refills | Status: DC

## 2018-04-30 MED ORDER — BUDESONIDE-FORMOTEROL FUMARATE 80-4.5 MCG/ACT IN AERO: 2.0000 | INHALATION_SPRAY | Freq: Two times a day (BID) | RESPIRATORY_TRACT | 3 refills | Status: DC

## 2018-04-30 MED ORDER — ALBUTEROL SULFATE HFA 108 (90 BASE) MCG/ACT IN AERS: 2.0000 | INHALATION_SPRAY | Freq: Four times a day (QID) | RESPIRATORY_TRACT | 0 refills | Status: DC | PRN

## 2018-04-30 NOTE — Progress Notes (Signed)
Name: Adam Palmer   MRN: [id number]    DOB: [birth year]   Date:04/30/2018       Progress Note  Subjective  Chief Complaint  Chief Complaint  Patient presents with  . Follow-up  . Medication Refill    I connected with Adam Palmer on 04/30/18 at  7:40 AM EDT by a video enabled telemedicine application and verified that I am speaking with the correct person using two identifiers.  I discussed the limitations of evaluation and management by telemedicine and the availability of in person appointments. The patient expressed understanding and agreed to proceed. Staff also discussed with the patient that there may be a patient responsible charge related to this service. Patient Location: Home Provider Location: Home Additional Individuals present: None  HPI  Hyperlipidemia: Current Medication Regimen: He hasn't been taking crestor or fenofibrate in over a month and has not taken the crestor inmany months. Last Lipids: No recent on file, we will check today as he is fasting.  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  Current Diet: tries to eat salad, does eat red meat and pork sometimes.   - Denies: Chest pain, shortness of breath, myalgias. Discussed risk of fenofibrate with statin therapy and developing rhabdo - verbalizes understanding.  - Documented aortic atherosclerosis? No - Risk factors for atherosclerosis: None  History Nephrolithiasis and Hematuria: Sees blood in his urine occasionally; saw Dr. Lonna CobbStoioff 04/29/2017 for cystoscopy. Doing okay at this time - takes flomax PRN.  Denies flank or back pain at present - only when he has hematuria. Unchanged and stable.  Anxiety and Panic Attacks: His Dad passed away when he was 13, this is when his anxiety started. He notes Mom and Dad both had/have anxiety too.  He has only been taking buspar PRN - which is not often; 1 panic attack about 1.5 months ago.  Current Medication Regimen: Effexor 150mg  Effexor and  this higher dose seems to be working well for him. Current Symptoms Include: Panic attacks occur about once a week - taking Xanax PRN currently - advised on our office policy, he verbalizes understanding.  Has tried Hydroxyzine in the past and it made him too sleepy.  We will try Buspar PRN Pt Denies the following symptoms: SI/HI Seeing Psychiatry? No Attending Counseling? No - Has in the past, does not feel he needs it at this time. PHQ-9 Score of:    [facility]  PHQ-9 Total Score  0     Overweight - Does yard work for work right now; otherwise not exercising.  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 Diet is regular - eating out about 50% of the time. Unchanged since last visit.  Night Shift - Works at [employer], working day shift in 1 week due to COVID-19. Doing okay sleeping during the day.  Usually sleeping 9:30-10am until about 3pm.  Does try to catch up on the weekends.  Children - 10yo son 6yo son, and 4yo daughter.   Asthma: He has been trying to pick up hours doing yard work since he has had less hours at work lately.  He had asthma as a kid and noticed more wheezing and shortness of breath this allergy season. He has been taking claritin with some relief along with albuterol neb from his daughter which helps significantly.  We will send in treatment today as we are unable to perform spirometry due to COVID-19 pandemic.  Patient Active Problem List   Diagnosis Date Noted  . Pure hypertriglyceridemia 10/29/2017  . Generalized anxiety disorder 10/29/2017  . Overweight (BMI 25.0-29.9) 10/29/2017  . Fatigue 10/29/2017  . Gross hematuria 04/29/2017  . Nephrolithiasis 04/29/2017    Past Surgical History:  Procedure Laterality Date  . APPENDECTOMY      Family History  Problem Relation Age of Onset  . Anxiety disorder  Mother   . Anxiety disorder Father   . Kidney disease Neg Hx   . Prostate cancer Neg Hx   . Bladder Cancer Neg Hx   . Kidney cancer Neg Hx     Social History   Socioeconomic History  . Marital status: Married    Spouse name: Baird Lyons  . Number of children: 3  . Years of education: Not on file  . Highest education level: Not on file  Occupational History  . Not on file  Social Needs  . Financial resource strain: Not hard at all  . Food insecurity:    Worry: Never true    Inability: Never true  . Transportation needs:    Medical: No    Non-medical: No  Tobacco Use  . Smoking status: Former Smoker    Packs/day: 0.50    Years: 4.00    Pack years: 2.00  . Smokeless tobacco: Current User    Types: Chew  . Tobacco comment: quit 9 years  Substance and Sexual Activity  . Alcohol use: Yes  . Drug use: No  . Sexual activity: Yes    Partners: Female  Lifestyle  . Physical activity:    Days per week: 4 days    Minutes per session: 60 min  . Stress: To some extent  Relationships  . Social connections:    Talks on phone: More than three times a week    Gets together: Once a week    Attends religious service: Never    Active member of club or organization: No    Attends meetings of clubs or organizations: Never    Relationship status: Married  . Intimate partner violence:    Fear of current or ex partner: No    Emotionally abused: No    Physically abused: No    Forced sexual activity: No  Other Topics Concern  . Not on file  Social History Narrative   Married, Wife is Baird Lyons, has a 10 (son), 75 (son), and 3yo (daughter)     Current Outpatient Medications:  .  busPIRone (BUSPAR) 7.5 MG tablet, Take 1 tablet (7.5 mg total) by mouth 3 (three) times daily., Disp: 30 tablet, Rfl: 0 .  fenofibrate 160 MG tablet, Take 160 mg by mouth daily., Disp: , Rfl:  .  ketoconazole (NIZORAL) 2 % cream, Apply 1 application topically daily., Disp: 15 g, Rfl: 0 .  rosuvastatin (CRESTOR) 40  MG tablet, Take 1 tablet by mouth 3 days a week., Disp: 30 tablet, Rfl: 1 .  tamsulosin (FLOMAX) 0.4 MG CAPS capsule, TAKE 1 CAPSULE BY MOUTH EVERY DAY, Disp: 30 capsule, Rfl: 3 .  venlafaxine XR (EFFEXOR-XR) 75 MG 24 hr capsule, TAKE 1 TABLET BY MOUTH DAILY X7 DAYS, THEN INCREASE TO 2 TABLETS BY MOUTH DAILY., Disp: 180 capsule, Rfl: 0 .  benzonatate (TESSALON) 200 MG capsule, Take 1 capsule (200 mg total) by mouth 2 (two) times daily as needed for cough. (Patient not taking: Reported on 04/30/2018),  Disp: 30 capsule, Rfl: 0 .  chlorpheniramine-HYDROcodone (TUSSIONEX PENNKINETIC ER) 10-8 MG/5ML SUER, Take 5 mLs by mouth every 12 (twelve) hours as needed for cough. (Patient not taking: Reported on 04/30/2018), Disp: 30 mL, Rfl: 0 .  magic mouthwash w/lidocaine SOLN, Take 5 mLs by mouth 3 (three) times daily as needed for mouth pain. 1 Part viscous lidocaine 2% 1 Part Maalox (do not substitute Kaopectate) 1 Part diphenhydramine 12.5 mg per 5 ml elixir. Or can substitute for pharmacy protocol. (Patient not taking: Reported on 04/30/2018), Disp: 100 mL, Rfl: 0  No Known Allergies  I personally reviewed active problem list, medication list, allergies, notes from last encounter, lab results with the patient/caregiver today.   ROS Constitutional: Negative for fever or weight change.  Respiratory: Positive for cough and shortness of breath - when outside doing yardwork, intermittent.   Cardiovascular: Negative for chest pain or palpitations.  Gastrointestinal: Negative for abdominal pain, no bowel changes.  Musculoskeletal: Negative for gait problem or joint swelling.  Skin: Negative for rash.  Neurological: Negative for dizziness or headache.  No other specific complaints in a complete review of systems (except as listed in HPI above).  Objective  Virtual encounter, vitals not obtained.  There is no height or weight on file to calculate BMI.  Physical Exam  Constitutional: Patient appears  well-developed and well-nourished. No distress.  HENT: Head: Normocephalic and atraumatic.  Neck: Normal range of motion. Pulmonary/Chest: Effort normal. No respiratory distress. Speaking in complete sentences Neurological: Pt is alert and oriented to person, place, and time. Coordination, speech and gait are normal.  Psychiatric: Patient has a normal mood and affect. behavior is normal. Judgment and thought content normal.  No results found for this or any previous visit (from the past 72 hour(s)).   PHQ2/9: Depression screen [facility] 2/9 04/30/2018 02/22/2018 01/29/2018 10/29/2017  Decreased Interest 0 0 0 0  Down, Depressed, Hopeless 0 0 1 0  PHQ - 2 Score 0 0 1 0  Altered sleeping 0 0 2 1  Tired, decreased energy 0 0 3 1  Change in appetite 0 0 0 0  Feeling bad or failure about yourself  0 0 0 1  Trouble concentrating 0 0 0 1  Moving slowly or fidgety/restless 0 0 1 1  Suicidal thoughts 0 0 0 0  PHQ-9 Score 0 0 7 5  Difficult doing work/chores Not difficult at all Not difficult at all Not difficult at all Somewhat difficult   PHQ-2/9 Result is negative.    Fall Risk: Fall Risk  04/30/2018 02/22/2018 10/29/2017  Falls in the past year? 0 0 No  Number falls in past yr: 0 0 -  Injury with Fall? 0 0 -  Follow up Falls evaluation completed - -    Assessment & Plan  1. Pure hypertriglyceridemia - diet discussed in detail, not wanting to take medications unless absolutely necessary - Lipid panel - COMPLETE METABOLIC PANEL WITH GFR  2. Nephrolithiasis - Taking flomax PRN and doing well. - COMPLETE METABOLIC PANEL WITH GFR  3. Generalized anxiety disorder - venlafaxine XR (EFFEXOR XR) 150 MG 24 hr capsule; Take 1 capsule (150 mg total) by mouth daily with breakfast.  Dispense: 90 capsule; Refill: 1  4. Overweight (BMI 25.0-29.9) - Discussed importance of 150 minutes of physical activity weekly, eat two servings of fish weekly, eat one serving of tree nuts ( cashews, pistachios, pecans,  almonds.Marland Kitchen) every other day, eat 6 servings of fruit/vegetables daily and drink plenty of water  and avoid sweet beverages.  5. Shifting sleep-work schedule Going back to day shift for several months next week.   6. Mild persistent asthma without complication - levocetirizine (XYZAL) 5 MG tablet; Take 1 tablet (5 mg total) by mouth every evening.  Dispense: 90 tablet; Refill: 1 - albuterol (PROVENTIL HFA;VENTOLIN HFA) 108 (90 Base) MCG/ACT inhaler; Inhale 2 puffs into the lungs every 6 (six) hours as needed for wheezing or shortness of breath.  Dispense: 1 Inhaler; Refill: 0 - budesonide-formoterol (SYMBICORT) 80-4.5 MCG/ACT inhaler; Inhale 2 puffs into the lungs 2 (two) times daily.  Dispense: 1 Inhaler; Refill: 3  7. Medication monitoring encounter - Lipid panel - COMPLETE METABOLIC PANEL WITH GFR - CBC w/Diff/Platelet   I discussed the assessment and treatment plan with the patient. The patient was provided an opportunity to ask questions and all were answered. The patient agreed with the plan and demonstrated an understanding of the instructions.  The patient was advised to call back or seek an in-person evaluation if the symptoms worsen or if the condition fails to improve as anticipated.  I provided 25 minutes of non-face-to-face time during this encounter.

## 2018-05-16 ENCOUNTER — Other Ambulatory Visit: Payer: Self-pay | Admitting: Family Medicine

## 2018-05-16 DIAGNOSIS — J4530 Mild persistent asthma, uncomplicated: Secondary | ICD-10-CM

## 2018-05-28 ENCOUNTER — Other Ambulatory Visit: Payer: Self-pay | Admitting: Family Medicine

## 2018-05-28 NOTE — Telephone Encounter (Signed)
Copied from CRM [id number]. Topic: Quick Communication - Rx Refill/Question >> May 28, 2018  3:32 PM Angela Nevin wrote: Medication: tamsulosin (FLOMAX) 0.4 MG CAPS capsule  Patient is requesting refill of this medication .  Preferred Pharmacy (with phone number or street name): [id number] [place], [place] - [address] [phone] (Phone) [phone] (Fax)

## 2018-05-31 MED ORDER — TAMSULOSIN HCL 0.4 MG PO CAPS: 0.4000 mg | ORAL_CAPSULE | Freq: Every day | ORAL | 2 refills | Status: DC

## 2018-06-28 ENCOUNTER — Telehealth: Payer: Self-pay | Admitting: Family Medicine

## 2018-06-28 NOTE — Telephone Encounter (Signed)
-----   Message from Doren Custard, FNP sent at 04/30/2018  7:55 AM EDT ----- Regarding: Have patient come in for fasting labs Have patient come in for fasting labs

## 2018-08-18 ENCOUNTER — Other Ambulatory Visit: Payer: Self-pay

## 2018-08-18 ENCOUNTER — Other Ambulatory Visit: Payer: Self-pay | Admitting: Family Medicine

## 2018-08-18 ENCOUNTER — Ambulatory Visit (INDEPENDENT_AMBULATORY_CARE_PROVIDER_SITE_OTHER)
Admission: RE | Admit: 2018-08-18 | Discharge: 2018-08-18 | Disposition: A | Discharge status: Home / Self Care | Payer: Managed Care, Other (non HMO) | Source: Ambulatory Visit

## 2018-08-18 DIAGNOSIS — R51 Headache: Secondary | ICD-10-CM

## 2018-08-18 DIAGNOSIS — J029 Acute pharyngitis, unspecified: Secondary | ICD-10-CM | POA: Diagnosis not present

## 2018-08-18 DIAGNOSIS — Z20822 Contact with and (suspected) exposure to covid-19: Secondary | ICD-10-CM

## 2018-08-18 MED ORDER — AMOXICILLIN 500 MG PO CAPS: 1000.0000 mg | ORAL_CAPSULE | Freq: Every day | ORAL | 0 refills | Status: AC

## 2018-08-18 NOTE — Discharge Instructions (Signed)
Recommend monitoring your symptoms for a few more days to see if it resolves.  This could be viral. Sending you for COVID testing. If symptoms worsen over the next couple days and you start developing worsening sore throat, fevers you can go ahead and start the antibiotics. Work note given to stay out till Monday and return if negative COVID results.

## 2018-08-19 ENCOUNTER — Other Ambulatory Visit: Payer: Self-pay

## 2018-08-19 ENCOUNTER — Encounter: Payer: Self-pay | Admitting: *Deleted

## 2018-08-19 ENCOUNTER — Emergency Department
Admission: EM | Admit: 2018-08-19 | Discharge: 2018-08-19 | Disposition: A | Discharge status: Home / Self Care | Payer: Managed Care, Other (non HMO) | Attending: Emergency Medicine | Admitting: Emergency Medicine

## 2018-08-19 DIAGNOSIS — Z87891 Personal history of nicotine dependence: Secondary | ICD-10-CM | POA: Insufficient documentation

## 2018-08-19 DIAGNOSIS — J45909 Unspecified asthma, uncomplicated: Secondary | ICD-10-CM | POA: Diagnosis not present

## 2018-08-19 DIAGNOSIS — Z79899 Other long term (current) drug therapy: Secondary | ICD-10-CM | POA: Diagnosis not present

## 2018-08-19 DIAGNOSIS — R509 Fever, unspecified: Secondary | ICD-10-CM | POA: Diagnosis present

## 2018-08-19 DIAGNOSIS — J020 Streptococcal pharyngitis: Secondary | ICD-10-CM

## 2018-08-19 MED ORDER — CLINDAMYCIN PHOSPHATE 600 MG/4ML IJ SOLN
600.0000 mg | Freq: Once | INTRAMUSCULAR | Status: AC
  Administered 2018-08-19: 19:00:00 600 mg via INTRAMUSCULAR
  Filled 2018-08-19: qty 4

## 2018-08-19 MED ORDER — CLINDAMYCIN HCL 300 MG PO CAPS: 300.0000 mg | ORAL_CAPSULE | Freq: Three times a day (TID) | ORAL | 0 refills | Status: AC

## 2018-08-19 MED ORDER — ACETAMINOPHEN 325 MG PO TABS
650.0000 mg | ORAL_TABLET | Freq: Once | ORAL | Status: AC
  Administered 2018-08-19: 650 mg via ORAL
  Filled 2018-08-19: qty 2

## 2018-08-19 NOTE — ED Notes (Signed)
See triage note  Presents with sore throat and fever  States  Temp at home was 102  Took some tylenol PTA   Was seen by his PCP by virtual visit yesterday  Started on amoxil

## 2018-08-19 NOTE — ED Provider Notes (Signed)
Virtual Visit via Video Note:  Adam Palmer  initiated request for Telemedicine visit with [facility] Urgent Care team. I connected with Adam Palmer  on 08/19/2018 at 8:13 AM  for a synchronized telemedicine visit using a video enabled HIPPA compliant telemedicine application. I verified that I am speaking with Adam Palmer  using two identifiers. Orvan July, NP  was physically located in a [facility] Urgent care site and JAICE DIGIOIA was located at a different location.   The limitations of evaluation and management by telemedicine as well as the availability of in-person appointments were discussed. Patient was informed that he  may incur a bill ( including co-pay) for this virtual visit encounter. Adam Palmer  expressed understanding and gave verbal consent to proceed with virtual visit.     History of Present Illness:Adam Palmer  is a 28 y.o. male presents with 2 days of sore throat that is worsening.  Pain with swallowing.  History of strep throat years back.  He has been taking ibuprofen and Tylenol for discomfort.  He is also had associated headache.  He denies any cough, chest congestion, fever, rhinorrhea, ear pain.  No no recent sick contacts.  Reporting that he looked at his throat and it was swollen and red.  Worse on the left side.  No trouble swallowing or breathing.  Past Medical History:  Diagnosis Date  . Anxiety   . Kidney calculi     No Known Allergies      Observations/Objective:VITALS: Per patient if applicable, see vitals. GENERAL: Alert, appears well and in no acute distress. HEENT: Atraumatic, conjunctiva clear, no obvious abnormalities on inspection of external nose and ears. NECK: Normal movements of the head and neck. CARDIOPULMONARY: No increased WOB. Speaking in clear sentences. I:E ratio WNL.  MS: Moves all visible extremities without noticeable abnormality. PSYCH: Pleasant and cooperative,  well-groomed. Speech normal rate and rhythm. Affect is appropriate. Insight and judgement are appropriate. Attention is focused, linear, and appropriate.  NEURO: CN grossly intact. Oriented as arrived to appointment on time with no prompting. Moves both UE equally.  SKIN: No obvious lesions, wounds, erythema, or cyanosis noted on face or hands.     Assessment and Plan: Most likely patient sore throat is viral related.  We will send him for COVID testing. Work note given Sent prescription for antibiotics to the pharmacy to go ahead and start in the next 3 to 4 days if symptoms are still persisting or worsening.   Follow Up Instructions:Follow up as needed for continued or worsening symptoms     I discussed the assessment and treatment plan with the patient. The patient was provided an opportunity to ask questions and all were answered. The patient agreed with the plan and demonstrated an understanding of the instructions.   The patient was advised to call back or seek an in-person evaluation if the symptoms worsen or if the condition fails to improve as anticipated.     Orvan July, NP  08/19/2018 8:13 AM         Orvan July, NP 08/19/18 [id number]

## 2018-08-19 NOTE — ED Triage Notes (Addendum)
Pt has a sore throat for 3 days.  Taking abx now but states not any better.  Pt took 1gm tyelnol at 1500 today   Pt alert.

## 2018-08-19 NOTE — ED Provider Notes (Signed)
[facility] Emergency Department Provider Note  ____________________________________________  Time seen: Approximately 6:36 PM  I have reviewed the triage vital signs and the nursing notes.   HISTORY  Chief Complaint Sore Throat    HPI Adam Palmer is a 28 y.o. male that presents to the emergency department for evaluation of fever and sore throat for 3 days.  Patient has a history of strep throat and states this feels the same.  He had a virtual visit yesterday morning and was started on amoxicillin.  He has taken 2 full days worth of amoxicillin and is not any better.  Patient is married and is faithful with one partner and denies any new partners.  No sick contacts.  Patient has a COVID test that is pending.  No nasal congestion, cough, shortness of breath.   Past Medical History:  Diagnosis Date  . Anxiety   . Kidney calculi     Patient Active Problem List   Diagnosis Date Noted  . Shifting sleep-work schedule 04/30/2018  . Mild persistent asthma without complication 04/30/2018  . Pure hypertriglyceridemia 10/29/2017  . Generalized anxiety disorder 10/29/2017  . Overweight (BMI 25.0-29.9) 10/29/2017  . Fatigue 10/29/2017  . Gross hematuria 04/29/2017  . Nephrolithiasis 04/29/2017    Past Surgical History:  Procedure Laterality Date  . APPENDECTOMY      Prior to Admission medications   Medication Sig Start Date End Date Taking? Authorizing Provider  amoxicillin (AMOXIL) 500 MG capsule Take 2 capsules (1,000 mg total) by mouth daily for 10 days. 08/18/18 08/28/18  Dahlia ByesBast, Traci A, NP  budesonide-formoterol (SYMBICORT) 80-4.5 MCG/ACT inhaler Inhale 2 puffs into the lungs 2 (two) times daily. 04/30/18   Doren CustardBoyce, [name] E, FNP  busPIRone (BUSPAR) 7.5 MG tablet Take 1 tablet (7.5 mg total) by mouth 3 (three) times daily. 10/29/17   Doren CustardBoyce, [name] E, FNP  clindamycin (CLEOCIN) 300 MG capsule Take 1 capsule (300 mg total) by mouth 3 (three) times daily for  10 days. 08/19/18 08/29/18  Enid DerryWagner, Shea Swalley, PA-C  levocetirizine (XYZAL) 5 MG tablet Take 1 tablet (5 mg total) by mouth every evening. 04/30/18   Doren CustardBoyce, [name] E, FNP  tamsulosin (FLOMAX) 0.4 MG CAPS capsule Take 1 capsule (0.4 mg total) by mouth daily. 05/31/18   Doren CustardBoyce, [name] E, FNP  venlafaxine XR (EFFEXOR XR) 150 MG 24 hr capsule Take 1 capsule (150 mg total) by mouth daily with breakfast. 04/30/18   Doren CustardBoyce, [name] E, FNP  VENTOLIN HFA 108 (90 Base) MCG/ACT inhaler TAKE 2 PUFFS BY MOUTH EVERY 6 HOURS AS NEEDED FOR WHEEZE OR SHORTNESS OF BREATH 05/17/18   Doren CustardBoyce, [name] E, FNP    Allergies Patient has no known allergies.  Family History  Problem Relation Age of Onset  . Anxiety disorder Mother   . Anxiety disorder Father   . Kidney disease Neg Hx   . Prostate cancer Neg Hx   . Bladder Cancer Neg Hx   . Kidney cancer Neg Hx     Social History Social History   Tobacco Use  . Smoking status: Former Smoker    Packs/day: 0.50    Years: 4.00    Pack years: 2.00  . Smokeless tobacco: Current User    Types: Chew  . Tobacco comment: quit 9 years  Substance Use Topics  . Alcohol use: Yes  . Drug use: No     Review of Systems  Constitutional: Positive for fever. Eyes: No visual changes. No discharge. ENT: Negative for congestion and  rhinorrhea.  Positive for sore throat. Cardiovascular: No chest pain. Respiratory: Negative for cough. No SOB. Gastrointestinal: No abdominal pain.  No nausea, no vomiting.  No diarrhea.  No constipation. Musculoskeletal: Negative for musculoskeletal pain. Skin: Negative for rash, abrasions, lacerations, ecchymosis. Neurological: Negative for headaches.   ____________________________________________   PHYSICAL EXAM:  VITAL SIGNS: ED Triage Vitals  Enc Vitals Group     BP 08/19/18 1654 115/60     Pulse Rate 08/19/18 1654 (!) 110     Resp 08/19/18 1835 18     Temp 08/19/18 1654 (!) 100.8 F (38.2 C)     Temp Source 08/19/18 1654 Oral     SpO2  08/19/18 1654 97 %     Weight 08/19/18 1655 195 lb (88.5 kg)     Height 08/19/18 1655 5\' 10"  (1.778 m)     Head Circumference --      Peak Flow --      Pain Score 08/19/18 1717 8     Pain Loc --      Pain Edu? --      Excl. in [place]? --      Constitutional: Alert and oriented. Well appearing and in no acute distress. Eyes: Conjunctivae are normal. PERRL. EOMI. No discharge. Head: Atraumatic. ENT: No frontal and maxillary sinus tenderness.      Ears: Tympanic membranes pearly gray with good landmarks. No discharge.      Nose: Mild congestion/rhinnorhea.      Mouth/Throat: Mucous membranes are moist. Oropharynx erythematous. Tonsils enlarged 2+ bilaterally with minimal exudates. Uvula midline. Neck: No stridor.   Hematological/Lymphatic/Immunilogical: No cervical lymphadenopathy. Cardiovascular: Normal rate, regular rhythm.  Good peripheral circulation. Respiratory: Normal respiratory effort without tachypnea or retractions. Lungs CTAB. Good air entry to the bases with no decreased or absent breath sounds. Gastrointestinal: Bowel sounds 4 quadrants. Soft and nontender to palpation. No guarding or rigidity. No palpable masses. No distention. Musculoskeletal: Full range of motion to all extremities. No gross deformities appreciated. Neurologic:  Normal speech and language. No gross focal neurologic deficits are appreciated.  Skin:  Skin is warm, dry and intact. No rash noted. Psychiatric: Mood and affect are normal. Speech and behavior are normal. Patient exhibits appropriate insight and judgement.   ____________________________________________   LABS (all labs ordered are listed, but only abnormal results are displayed)  Labs Reviewed - No data to display ____________________________________________  EKG   ____________________________________________  RADIOLOGY   No results found.  ____________________________________________    PROCEDURES  Procedure(s) performed:     Procedures    Medications  acetaminophen (TYLENOL) tablet 650 mg (has no administration in time range)  clindamycin (CLEOCIN) injection 600 mg (has no administration in time range)     ____________________________________________   INITIAL IMPRESSION / ASSESSMENT AND PLAN / ED COURSE  Pertinent labs & imaging results that were available during my care of the patient were reviewed by me and considered in my medical decision making (see chart for details).  Review of the [place] CSRS was performed in accordance of the [place] prior to dispensing any controlled drugs.   Patient's diagnosis is consistent with strep pharyngitis. Vital signs and exam are reassuring.  Patient has already been on 2 days worth of antibiotics, I do not think that strep test will be accurate.  Patient appears well and is staying well hydrated. Patient should alternate tylenol and ibuprofen for fever. Patient feels comfortable going home. Patient will be discharged home with prescriptions for clindamycin.  Patient will continue amoxicillin.  Patient is to follow up with primary care as needed or otherwise directed. Patient is given ED precautions to return to the ED for any worsening or new symptoms.  Adam Palmer was evaluated in Emergency Department on 08/19/2018 for the symptoms described in the history of present illness. He was evaluated in the context of the global COVID-19 pandemic, which necessitated consideration that the patient might be at risk for infection with the SARS-CoV-2 virus that causes COVID-19. Institutional protocols and algorithms that pertain to the evaluation of patients at risk for COVID-19 are in a state of rapid change based on information released by regulatory bodies including the CDC and federal and state organizations. These policies and algorithms were followed during the patient's care in the ED.   ____________________________________________  FINAL CLINICAL IMPRESSION(S) / ED  DIAGNOSES  Final diagnoses:  Strep throat      NEW MEDICATIONS STARTED DURING THIS VISIT:  ED Discharge Orders         Ordered    clindamycin (CLEOCIN) 300 MG capsule  3 times daily     08/19/18 1833              This chart was dictated using voice recognition software/Dragon. Despite best efforts to proofread, errors can occur which can change the meaning. Any change was purely unintentional.    Enid DerryWagner, Jp [place], PA-C 08/19/18 2053    Minna AntisPaduchowski, Kevin, MD 08/19/18 2252

## 2018-08-21 LAB — NOVEL CORONAVIRUS, NAA: SARS-CoV-2, NAA: NOT DETECTED

## 2018-08-26 ENCOUNTER — Other Ambulatory Visit: Payer: Self-pay | Admitting: Family Medicine

## 2018-10-17 ENCOUNTER — Other Ambulatory Visit: Payer: Self-pay | Admitting: Family Medicine

## 2018-10-17 DIAGNOSIS — F411 Generalized anxiety disorder: Secondary | ICD-10-CM

## 2018-11-24 ENCOUNTER — Other Ambulatory Visit: Payer: Self-pay

## 2018-11-24 ENCOUNTER — Encounter: Payer: Self-pay | Admitting: Family Medicine

## 2018-11-24 ENCOUNTER — Ambulatory Visit (INDEPENDENT_AMBULATORY_CARE_PROVIDER_SITE_OTHER): Payer: Managed Care, Other (non HMO) | Admitting: Family Medicine

## 2018-11-24 DIAGNOSIS — G4726 Circadian rhythm sleep disorder, shift work type: Secondary | ICD-10-CM | POA: Diagnosis not present

## 2018-11-24 DIAGNOSIS — E781 Pure hyperglyceridemia: Secondary | ICD-10-CM

## 2018-11-24 DIAGNOSIS — E663 Overweight: Secondary | ICD-10-CM

## 2018-11-24 DIAGNOSIS — N200 Calculus of kidney: Secondary | ICD-10-CM | POA: Diagnosis not present

## 2018-11-24 DIAGNOSIS — F411 Generalized anxiety disorder: Secondary | ICD-10-CM | POA: Diagnosis not present

## 2018-11-24 DIAGNOSIS — J4530 Mild persistent asthma, uncomplicated: Secondary | ICD-10-CM

## 2018-11-24 MED ORDER — ARMODAFINIL 50 MG PO TABS: 50.0000 mg | ORAL_TABLET | Freq: Every day | ORAL | 1 refills | Status: DC

## 2018-11-24 MED ORDER — VENLAFAXINE HCL ER 37.5 MG PO CP24: ORAL_CAPSULE | ORAL | 0 refills | Status: DC

## 2018-11-24 MED ORDER — ALPRAZOLAM 0.25 MG PO TABS: 0.2500 mg | ORAL_TABLET | Freq: Two times a day (BID) | ORAL | 0 refills | Status: DC | PRN

## 2018-11-24 MED ORDER — VORTIOXETINE HBR 10 MG PO TABS: ORAL_TABLET | ORAL | 1 refills | Status: DC

## 2018-11-24 NOTE — Progress Notes (Signed)
Name: Adam Palmer   MRN: [id number]    DOB: 1990-09-04   Date:11/24/2018       Progress Note  Subjective  Chief Complaint  Chief Complaint  Patient presents with  . Medication Problem  . Insomnia    sleeping to much, work shift change    I connected with  Adam Palmer  on 11/24/18 at  7:40 AM EDT by a video enabled telemedicine application and verified that I am speaking with the correct person using two identifiers.  I discussed the limitations of evaluation and management by telemedicine and the availability of in person appointments. The patient expressed understanding and agreed to proceed. Staff also discussed with the patient that there may be a patient responsible charge related to this service. Patient Location: Home Provider Location: Home Office Additional Individuals present: None  HPI  Hyperlipidemia: Current Medication Regimen: He hasn't been taking crestor or fenofibrate; he was supposed to come in for labs in April, however with COVID-19 pandemic lockdown he was unable and then forgot.  He will come in this afternoon for labs.  History Nephrolithiasis and Hematuria: Sees blood in his urine occasionally; saw Dr. Bernardo Heater 04/29/2017 for cystoscopy. Doing okay at this time - takes flomax PRN. Denies flank or back pain at present - only when he has hematuria. Unchanged and stable.  Anxiety and Panic Attacks: His Dad passed away when he was 28, this is when his anxiety started. He notes Mom and Dad both had/have anxiety too.  He has only been taking buspar PRN - which is not often; 1 panic attack about 1.5 months ago.  Current Medication Regimen:Effexor 150mg  Effexor and this higher dose seems to be working well for him, however he notes that it has significantly impacted him libido.  Would like to switch to Trintellix to see if this helps. Current Symptoms Include:Panic attacks still occurring about <5 in a month.  Able to calm himself; buspar and  hydroxyzine made him too sleepy.  He has used Xanax in the past and it did work better for him - we will provide #10 for the year. Pt Denies the following symptoms:SI/HI Seeing Psychiatry?No Attending Counseling?No- Has in the past, does not feel he needs it at this time. PHQ-9 Score of:   [facility] from 11/24/2018 in [facility]  PHQ-9 Total Score  6     Overweight - Does yard work for work right now; otherwise not exercising. Diet is regular - eating out about 50% of the time. Unchanged since last visit.  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  He is really struggling with sleeping too much on the weekends.  During the week he sleeps from 8am-4:30pm, but on the weekends he is sleeping up to 12 hours a day and has a hard time staying awake.Children - 10yo son 6yo son, and 29yo daughter.  We discussed medicaiton options - we will trial low dose armodafonil  Asthma: He is doing well without maintenance inhalers at this time. No chest tightness, coughing, not waking while sleeping, no shortness of breath.  Patient Active Problem List   Diagnosis Date Noted  . Shifting sleep-work schedule 04/30/2018  . Mild persistent asthma without complication 60/63/0160  . Pure hypertriglyceridemia 10/29/2017  . Generalized anxiety disorder 10/29/2017  . Overweight (BMI 25.0-29.9) 10/29/2017  . Fatigue 10/29/2017  . Gross hematuria 04/29/2017  . Nephrolithiasis  04/29/2017    Past Surgical History:  Procedure Laterality Date  . APPENDECTOMY      Family History  Problem Relation Age of Onset  . Anxiety disorder Mother   . Anxiety disorder Father   . Kidney disease Neg Hx   . Prostate cancer Neg Hx   . Bladder Cancer Neg Hx   . Kidney cancer Neg Hx     Social History   Socioeconomic History  . Marital status: Married    Spouse name: Adam Palmer  .  Number of children: 3  . Years of education: Not on file  . Highest education level: Not on file  Occupational History  . Not on file  Social Needs  . Financial resource strain: Not hard at all  . Food insecurity    Worry: Never true    Inability: Never true  . Transportation needs    Medical: No    Non-medical: No  Tobacco Use  . Smoking status: Former Smoker    Packs/day: 0.50    Years: 4.00    Pack years: 2.00  . Smokeless tobacco: Current User    Types: Chew  . Tobacco comment: quit 9 years  Substance and Sexual Activity  . Alcohol use: Yes  . Drug use: No  . Sexual activity: Yes    Partners: Female  Lifestyle  . Physical activity    Days per week: 4 days    Minutes per session: 60 min  . Stress: To some extent  Relationships  . Social connections    Talks on phone: More than three times a week    Gets together: Once a week    Attends religious service: Never    Active member of club or organization: No    Attends meetings of clubs or organizations: Never    Relationship status: Married  . Intimate partner violence    Fear of current or ex partner: No    Emotionally abused: No    Physically abused: No    Forced sexual activity: No  Other Topics Concern  . Not on file  Social History Narrative   Married, Wife is Adam Palmer, has a 5910 (son), 666 (son), and 3yo (daughter)     Current Outpatient Medications:  .  levocetirizine (XYZAL) 5 MG tablet, Take 1 tablet (5 mg total) by mouth every evening., Disp: 90 tablet, Rfl: 1 .  tamsulosin (FLOMAX) 0.4 MG CAPS capsule, TAKE 1 CAPSULE BY MOUTH EVERY DAY, Disp: 90 capsule, Rfl: 0 .  venlafaxine XR (EFFEXOR-XR) 150 MG 24 hr capsule, TAKE 1 CAPSULE (150 MG TOTAL) BY MOUTH DAILY WITH BREAKFAST., Disp: 90 capsule, Rfl: 1 .  budesonide-formoterol (SYMBICORT) 80-4.5 MCG/ACT inhaler, Inhale 2 puffs into the lungs 2 (two) times daily., Disp: 1 Inhaler, Rfl: 3 .  busPIRone (BUSPAR) 7.5 MG tablet, Take 1 tablet (7.5 mg total) by mouth  3 (three) times daily., Disp: 30 tablet, Rfl: 0 .  VENTOLIN HFA 108 (90 Base) MCG/ACT inhaler, TAKE 2 PUFFS BY MOUTH EVERY 6 HOURS AS NEEDED FOR WHEEZE OR SHORTNESS OF BREATH, Disp: 18 Inhaler, Rfl: 0  No Known Allergies  I personally reviewed active problem list, medication list, allergies, notes from last encounter, lab results with the patient/caregiver today.   ROS  Ten systems reviewed and is negative except as mentioned in HPI  Objective  Virtual encounter, vitals not obtained.  There is no height or weight on file to calculate BMI.  Physical Exam Pulmonary/Chest: Effort normal. No respiratory distress. Speaking in  complete sentences Neurological: Pt is alert and oriented to person, place, and time. Coordination, speech and gait are normal.  Psychiatric: Patient has a normal mood and affect. behavior is normal. Judgment and thought content normal.  No results found for this or any previous visit (from the past 72 hour(s)).  PHQ2/9: Depression screen [facility] 2/9 11/24/2018 04/30/2018 02/22/2018 01/29/2018 10/29/2017  Decreased Interest 1 0 0 0 0  Down, Depressed, Hopeless 1 0 0 1 0  PHQ - 2 Score 2 0 0 1 0  Altered sleeping 3 0 0 2 1  Tired, decreased energy 1 0 0 3 1  Change in appetite 0 0 0 0 0  Feeling bad or failure about yourself  0 0 0 0 1  Trouble concentrating 0 0 0 0 1  Moving slowly or fidgety/restless 0 0 0 1 1  Suicidal thoughts 0 0 0 0 0  PHQ-9 Score 6 0 0 7 5  Difficult doing work/chores Somewhat difficult Not difficult at all Not difficult at all Not difficult at all Somewhat difficult   PHQ-2/9 Result is positive.    Fall Risk: Fall Risk  11/24/2018 04/30/2018 02/22/2018 10/29/2017  Falls in the past year? 0 0 0 No  Number falls in past yr: 0 0 0 -  Injury with Fall? 0 0 0 -  Follow up Falls evaluation completed Falls evaluation completed - -    Assessment & Plan  1. Generalized anxiety disorder - See tapering instructions on AVS; #15 Xanax to last 1 year  - he understands that we will not provide early refill; will keep in locked area away from his children or others. - ALPRAZolam (XANAX) 0.25 MG tablet; Take 1 tablet (0.25 mg total) by mouth 2 (two) times daily as needed for anxiety. To last 1 year.  Dispense: 15 tablet; Refill: 0 - venlafaxine XR (EFFEXOR XR) 37.5 MG 24 hr capsule; Take 2 tablets once daily for 7 days, then go down to 1 tablet once daily for 7 days, then stop completely.  Dispense: 30 capsule; Refill: 0 - vortioxetine HBr (TRINTELLIX) 10 MG TABS tablet; Take 1/2 tablet once daily for 7 days, then increase to 1 tablet once daily.  Dispense: 30 tablet; Refill: 1  2. Shifting sleep-work schedule - Armodafinil per orders - low dose to start as he also struggles with anxiety.  - Armodafinil 50 MG tablet; Take 1 tablet (50 mg total) by mouth daily.  Dispense: 30 tablet; Refill: 1  3. Shift work sleep disorder - Armodafinil 50 MG tablet; Take 1 tablet (50 mg total) by mouth daily.  Dispense: 30 tablet; Refill: 1  4. Nephrolithiasis - Flomax PRN; follow with urology PRN  5. Pure hypertriglyceridemia - Needs to come in for labs ASAP - will try to come in today  6. Overweight (BMI 25.0-29.9) - Discussed importance of 150 minutes of physical activity weekly, eat two servings of fish weekly, eat one serving of tree nuts ( cashews, pistachios, pecans, almonds.Marland Kitchen) every other day, eat 6 servings of fruit/vegetables daily and drink plenty of water and avoid sweet beverages.   7. Mild persistent asthma without complication - Stable and doing well   I discussed the assessment and treatment plan with the patient. The patient was provided an opportunity to ask questions and all were answered. The patient agreed with the plan and demonstrated an understanding of the instructions.  The patient was advised to call back or seek an in-person evaluation if the symptoms worsen or if the  condition fails to improve as anticipated.  I provided 23  minutes of non-face-to-face time during this encounter.

## 2018-11-24 NOTE — Patient Instructions (Signed)
Once Trintellix is approved: 1. Take 75mg  Venlafaxine once daily  For 7 days and take 1/2 tablet (5mg ) Trintellix once daily for 7 days. 2.  Then take 37.5mg  Venlafaxine once daily for 7 days and increase Trintellix to 10mg  (1 tablet) once daily. 3.  STOP venlafaxine completely, continue Trintellix 10mg  once daily.  Xanax - 15 tablets to last 1 year - for severe panic attacks only.  Armodafonil - may start right away - we are starting you at a low dose of 50mg  to avoid increased anxiety with this medication.  Take one tablet once daily upon waking.

## 2018-11-25 ENCOUNTER — Other Ambulatory Visit: Payer: Self-pay | Admitting: Family Medicine

## 2018-11-25 DIAGNOSIS — E782 Mixed hyperlipidemia: Secondary | ICD-10-CM

## 2018-11-25 DIAGNOSIS — E781 Pure hyperglyceridemia: Secondary | ICD-10-CM

## 2018-11-25 LAB — LIPID PANEL
Cholesterol: 207 mg/dL — ABNORMAL HIGH (ref <200)
HDL: 32 mg/dL — ABNORMAL LOW (ref >=40)
LDL Cholesterol (Calc): 119 mg/dL (calc) — ABNORMAL HIGH
Non-HDL Cholesterol (Calc): 175 mg/dL (calc) — ABNORMAL HIGH (ref <130)
Total CHOL/HDL Ratio: 6.5 (calc) — ABNORMAL HIGH (ref <5.0)
Triglycerides: 390 mg/dL — ABNORMAL HIGH (ref <150)

## 2018-11-25 LAB — COMPLETE METABOLIC PANEL WITH GFR
AG Ratio: 1.6 (calc) (ref 1.0–2.5)
ALT: 28 U/L (ref 9–46)
AST: 23 U/L (ref 10–40)
Albumin: 5.1 g/dL (ref 3.6–5.1)
Alkaline phosphatase (APISO): 86 U/L (ref 36–130)
BUN: 10 mg/dL (ref 7–25)
CO2: 28 mmol/L (ref 20–32)
Calcium: 10.4 mg/dL — ABNORMAL HIGH (ref 8.6–10.3)
Chloride: 101 mmol/L (ref 98–110)
Creat: 0.85 mg/dL (ref 0.60–1.35)
GFR, Est African American: 136 mL/min/{1.73_m2} (ref >=60)
GFR, Est Non African American: 117 mL/min/{1.73_m2} (ref >=60)
Globulin: 3.2 g/dL (calc) (ref 1.9–3.7)
Glucose, Bld: 90 mg/dL (ref 65–99)
Potassium: 4.4 mmol/L (ref 3.5–5.3)
Sodium: 138 mmol/L (ref 135–146)
Total Bilirubin: 0.6 mg/dL (ref 0.2–1.2)
Total Protein: 8.3 g/dL — ABNORMAL HIGH (ref 6.1–8.1)

## 2018-11-25 LAB — CBC WITH DIFFERENTIAL/PLATELET
Absolute Monocytes: 1088 cells/uL — ABNORMAL HIGH (ref 200–950)
Basophils Absolute: 69 cells/uL (ref 0–200)
Basophils Relative: 0.7 %
Eosinophils Absolute: 274 cells/uL (ref 15–500)
Eosinophils Relative: 2.8 %
HCT: 47.1 % (ref 38.5–50.0)
Hemoglobin: 15.7 g/dL (ref 13.2–17.1)
Lymphs Abs: 3332 cells/uL (ref 850–3900)
MCH: 29.3 pg (ref 27.0–33.0)
MCHC: 33.3 g/dL (ref 32.0–36.0)
MCV: 87.9 fL (ref 80.0–100.0)
MPV: 11.1 fL (ref 7.5–12.5)
Monocytes Relative: 11.1 %
Neutro Abs: 5037 cells/uL (ref 1500–7800)
Neutrophils Relative %: 51.4 %
Platelets: 301 10*3/uL (ref 140–400)
RBC: 5.36 10*6/uL (ref 4.20–5.80)
RDW: 14 % (ref 11.0–15.0)
Total Lymphocyte: 34 %
WBC: 9.8 10*3/uL (ref 3.8–10.8)

## 2018-11-25 MED ORDER — ICOSAPENT ETHYL 1 G PO CAPS: 2.0000 | ORAL_CAPSULE | Freq: Two times a day (BID) | ORAL | 2 refills | Status: DC

## 2018-11-25 MED ORDER — ROSUVASTATIN CALCIUM 20 MG PO TABS: 20.0000 mg | ORAL_TABLET | Freq: Every day | ORAL | 3 refills | Status: DC

## 2018-12-11 ENCOUNTER — Encounter: Payer: Self-pay | Admitting: Family Medicine

## 2018-12-13 ENCOUNTER — Telehealth: Payer: Self-pay | Admitting: Emergency Medicine

## 2018-12-13 NOTE — Telephone Encounter (Signed)
Not able to leave a message as his mailbox is full

## 2018-12-13 NOTE — Telephone Encounter (Signed)
Please advise 

## 2018-12-13 NOTE — Telephone Encounter (Signed)
Copied from [place] [id number]. Topic: General - Inquiry >> Dec 13, 2018 12:29 PM [name] wrote: Reason for CRM: pt called in and just wanted to fu on the My Chart message he sent in on 11/14 [name],  I have good news and bad news. I am not liking the trintillex meds for my anxiety. I am having more trouble with this one far as panic attacks and the side affects. I am going to stop taking it and start back taking my Effexor XR 150mg . I am going to start with taking 75mg  and work back to 150mg  with the left over prescriptions that I have. Good news is the energy pill is working great. I wanted to make you aware of this change.   Thank you -Adam Palmer

## 2018-12-13 NOTE — Telephone Encounter (Signed)
Please ask patient to check his Mychart messages for my response and schedule a virtual or in person appointment in the next week.

## 2018-12-17 ENCOUNTER — Ambulatory Visit (INDEPENDENT_AMBULATORY_CARE_PROVIDER_SITE_OTHER): Payer: Managed Care, Other (non HMO) | Admitting: Family Medicine

## 2018-12-17 ENCOUNTER — Other Ambulatory Visit: Payer: Self-pay

## 2018-12-17 ENCOUNTER — Encounter: Payer: Self-pay | Admitting: Family Medicine

## 2018-12-17 VITALS — BP 118/72 | HR 87 | Temp 97.9°F | Resp 18 | Ht 69.0 in | Wt 197.2 lb

## 2018-12-17 DIAGNOSIS — G4726 Circadian rhythm sleep disorder, shift work type: Secondary | ICD-10-CM

## 2018-12-17 DIAGNOSIS — F411 Generalized anxiety disorder: Secondary | ICD-10-CM

## 2018-12-17 MED ORDER — ESCITALOPRAM OXALATE 10 MG PO TABS: ORAL_TABLET | ORAL | 1 refills | Status: DC

## 2018-12-17 NOTE — Progress Notes (Signed)
Name: Adam Palmer   MRN: [id number]    DOB: [birth year]   Date:12/17/2018       Progress Note  Subjective  Chief Complaint  Chief Complaint  Patient presents with  . Follow-up    medication changes for anxiety    HPI  Anxiety and Panic Attacks: Was switched off of effexor and onto trintellix - had headaches and nausea, and stopped Trintellix after 2 weeks due to this.  He went back to 150mg  of Effexor himself, but dropped down to 37.5mg  on 12/13/2018 after messaging me on MyChart.  He is not experiencing SE's, but is having increased anxiety since being on lower dose.  150mg  Effexor caused low libido and was waning in efficacy.  Has needed 2 Xanax since our last visit.  Has never been on Lexapro or Celexa, zoloft, or prozac. Denies SI/HI.  Taking armodafonil for shift work sleep disorder, does not feel that this is increasing his anxiety.  Interim History from 2018-12-12 His Dad passed away when he was 56, this is when his anxiety started. He notes Mom and Dad both had/have anxiety too.He has only been taking buspar PRN - which is not often; 1 panic attack about 1.5 months ago. Current Medication Regimen:Effexor150mg  Effexor and this higher dose seems to be working well for him, however he notes that it has significantly impacted him libido.  Would like to switch to Trintellix to see if this helps. Current Symptoms Include:Panic attacks still occurring about <5 in a month.  Able to calm himself; buspar and hydroxyzine made him too sleepy.  He has used Xanax in the past and it did work better for him - we will provide #10 for the year. Pt Denies the following symptoms:SI/HI Seeing Psychiatry?No Attending Counseling?No- Has in the past, does not feel he needs it at this time. Once Trintellix is approved: 1. Take 75mg  Venlafaxine once daily  For 7 days and take 1/2 tablet (5mg ) Trintellix once daily for 7 days. 2.  Then take 37.5mg  Venlafaxine once daily for 7 days and  increase Trintellix to 10mg  (1 tablet) once daily. 3.  STOP venlafaxine completely, continue Trintellix 10mg  once daily. Xanax - 15 tablets to last 1 year - for severe panic attacks only.  Night Shift Sleep Disorder-Works at [employer], working night shift again. 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.  He was really struggling with sleeping too much on the weekends.  During the week he sleeps from 8am-4:30pm, but on the weekends he is sleeping up to 12 hours a day and has a hard time staying awake.Children - 10yo son 6yo son, and4yo daughter. Tried 50mg  Armodafonil and is now only taking 3 tablets a week when he is off to help him remain awake and engaging with his family.  This has significantly improved his quality of life with his family.    Patient Active Problem List   Diagnosis Date Noted  . Shifting sleep-work schedule 04/30/2018  . Mild persistent asthma without complication 35/57/3220  . Pure hypertriglyceridemia 10/29/2017  . Generalized anxiety disorder 10/29/2017  . Overweight (BMI 25.0-29.9) 10/29/2017  . Fatigue 10/29/2017  . Gross hematuria 04/29/2017  . Nephrolithiasis 04/29/2017    Past Surgical History:  Procedure Laterality Date  . APPENDECTOMY      Family History  Problem Relation Age of Onset  . Anxiety disorder Mother   . Anxiety disorder Father   . Kidney disease Neg Hx   . Prostate cancer  Neg Hx   . Bladder Cancer Neg Hx   . Kidney cancer Neg Hx     Social History   Socioeconomic History  . Marital status: Married    Spouse name: Baird LyonsCasey  . Number of children: 3  . Years of education: Not on file  . Highest education level: Not on file  Occupational History  . Not on file  Social Needs  . Financial resource strain: Not hard at all  . Food insecurity    Worry: Never true    Inability: Never true  . Transportation needs    Medical: No    Non-medical: No  Tobacco Use  . Smoking status: Former Smoker     Packs/day: 0.50    Years: 4.00    Pack years: 2.00  . Smokeless tobacco: Current User    Types: Chew  . Tobacco comment: quit 9 years  Substance and Sexual Activity  . Alcohol use: Yes  . Drug use: No  . Sexual activity: Yes    Partners: Female  Lifestyle  . Physical activity    Days per week: 4 days    Minutes per session: 60 min  . Stress: To some extent  Relationships  . Social connections    Talks on phone: More than three times a week    Gets together: Once a week    Attends religious service: Never    Active member of club or organization: No    Attends meetings of clubs or organizations: Never    Relationship status: Married  . Intimate partner violence    Fear of current or ex partner: No    Emotionally abused: No    Physically abused: No    Forced sexual activity: No  Other Topics Concern  . Not on file  Social History Narrative   Married, Wife is Baird LyonsCasey, has a 1910 (son), 446 (son), and 3yo (daughter)     Current Outpatient Medications:  .  ALPRAZolam (XANAX) 0.25 MG tablet, Take 1 tablet (0.25 mg total) by mouth 2 (two) times daily as needed for anxiety. To last 1 year., Disp: 15 tablet, Rfl: 0 .  Armodafinil 50 MG tablet, Take 1 tablet (50 mg total) by mouth daily., Disp: 30 tablet, Rfl: 1 .  Icosapent Ethyl 1 g CAPS, Take 2 capsules (2 g total) by mouth 2 (two) times daily., Disp: 360 capsule, Rfl: 2 .  levocetirizine (XYZAL) 5 MG tablet, Take 1 tablet (5 mg total) by mouth every evening., Disp: 90 tablet, Rfl: 1 .  rosuvastatin (CRESTOR) 20 MG tablet, Take 1 tablet (20 mg total) by mouth daily., Disp: 90 tablet, Rfl: 3 .  tamsulosin (FLOMAX) 0.4 MG CAPS capsule, TAKE 1 CAPSULE BY MOUTH EVERY DAY, Disp: 90 capsule, Rfl: 0 .  venlafaxine XR (EFFEXOR XR) 37.5 MG 24 hr capsule, Take 2 tablets once daily for 7 days, then go down to 1 tablet once daily for 7 days, then stop completely., Disp: 30 capsule, Rfl: 0  No Known Allergies  I personally reviewed active  problem list, medication list, allergies, notes from last encounter, lab results with the patient/caregiver today.   ROS  Ten systems reviewed and is negative except as mentioned in HPI  Objective  Vitals:   12/17/18 1130  BP: 118/72  Pulse: 87  Resp: 18  Temp: 97.9 F (36.6 C)  TempSrc: Temporal  SpO2: 99%  Weight: 197 lb 3.2 oz (89.4 kg)  Height: 5\' 9"  (1.753 m)   Body mass index  is 29.12 kg/m.  Physical Exam  Constitutional: Patient appears well-developed and well-nourished. No distress.  HENT: Head: Normocephalic and atraumatic.  Eyes: Conjunctivae and EOM are normal. No scleral icterus.  Neck: Normal range of motion. Neck supple. No JVD present. No thyromegaly present.  Cardiovascular: Normal rate, regular rhythm and normal heart sounds.  No murmur heard. No BLE edema. Pulmonary/Chest: Effort normal and breath sounds normal. No respiratory distress. Musculoskeletal: Normal range of motion, no joint effusions. No gross deformities Neurological: Pt is alert and oriented to person, place, and time. No cranial nerve deficit. Coordination, balance, strength, speech and gait are normal.  Skin: Skin is warm and dry. No rash noted. No erythema.  Psychiatric: Patient has a normal mood and affect. behavior is normal. Judgment and thought content normal.  No results found for this or any previous visit (from the past 72 hour(s)).  PHQ2/9: Depression screen [facility] 2/9 12/17/2018 11/24/2018 04/30/2018 02/22/2018 01/29/2018  Decreased Interest 1 1 0 0 0  Down, Depressed, Hopeless 1 1 0 0 1  PHQ - 2 Score 2 2 0 0 1  Altered sleeping 3 3 0 0 2  Tired, decreased energy 1 1 0 0 3  Change in appetite 0 0 0 0 0  Feeling bad or failure about yourself  0 0 0 0 0  Trouble concentrating 0 0 0 0 0  Moving slowly or fidgety/restless 0 0 0 0 1  Suicidal thoughts 0 0 0 0 0  PHQ-9 Score 6 6 0 0 7  Difficult doing work/chores Somewhat difficult Somewhat difficult Not difficult at all Not difficult  at all Not difficult at all   PHQ-2/9 Result is positive.    Fall Risk: Fall Risk  12/17/2018 11/24/2018 04/30/2018 02/22/2018 10/29/2017  Falls in the past year? 0 0 0 0 No  Number falls in past yr: 0 0 0 0 -  Injury with Fall? 0 0 0 0 -  Follow up Falls evaluation completed Falls evaluation completed Falls evaluation completed - -   Assessment & Plan  1. Generalized anxiety disorder - escitalopram (LEXAPRO) 10 MG tablet; Take 1/2 tablet once daily for 7 days, then increase to 1 tablet once daily.  Dispense: 30 tablet; Refill: 1 - See AVS for medication change instructions. Plan for BMP at follow up.  2. Shifting sleep-work schedule - Continue Armodafonil 3 times weekly.

## 2018-12-17 NOTE — Patient Instructions (Addendum)
Effexor: Take 37.5mg  for another week once daily, then stop completely.  Start Lexapro today at 1/2 tablet once daily for 7 days, then increase to 1 tablet once daily ongoing.

## 2019-01-06 ENCOUNTER — Other Ambulatory Visit: Payer: Self-pay

## 2019-01-06 DIAGNOSIS — Z20822 Contact with and (suspected) exposure to covid-19: Secondary | ICD-10-CM

## 2019-01-08 ENCOUNTER — Other Ambulatory Visit: Payer: Self-pay | Admitting: Family Medicine

## 2019-01-08 DIAGNOSIS — F411 Generalized anxiety disorder: Secondary | ICD-10-CM

## 2019-01-08 LAB — NOVEL CORONAVIRUS, NAA: SARS-CoV-2, NAA: NOT DETECTED

## 2019-01-09 NOTE — Telephone Encounter (Signed)
Requested medication (s) are due for refill today: yes  Requested medication (s) are on the active medication list: yes  Last refill:  12/17/2018  Future visit scheduled: yes  Notes to clinic:  requesting a 90 day supply   Requested Prescriptions  Pending Prescriptions Disp Refills   escitalopram (LEXAPRO) 10 MG tablet [Pharmacy Med Name: ESCITALOPRAM 10 MG TABLET] 90 tablet 1    Sig: Take 1/2 tablet once daily for 7 days, then increase to 1 tablet once daily.      Psychiatry:  Antidepressants - SSRI Passed - 01/08/2019 10:34 AM      Passed - Valid encounter within last 6 months    Recent Outpatient Visits           3 weeks ago Generalized anxiety disorder   [place], FNP   1 month ago Generalized anxiety disorder   [place], FNP   8 months ago Pure hypertriglyceridemia   [place], FNP   10 months ago Flu-like symptoms   [place], NP   11 months ago Annual physical exam   [place], FNP       Future Appointments             In 1 week [facility], [facility], [facility]

## 2019-01-18 ENCOUNTER — Ambulatory Visit
Admission: RE | Admit: 2019-01-18 | Discharge: 2019-01-18 | Disposition: A | Discharge status: Home / Self Care | Payer: Managed Care, Other (non HMO) | Source: Ambulatory Visit | Attending: Family Medicine | Admitting: Family Medicine

## 2019-01-18 ENCOUNTER — Other Ambulatory Visit: Payer: Self-pay | Admitting: Family Medicine

## 2019-01-18 ENCOUNTER — Ambulatory Visit
Admission: RE | Admit: 2019-01-18 | Discharge: 2019-01-18 | Disposition: A | Discharge status: Home / Self Care | Payer: Managed Care, Other (non HMO) | Attending: Family Medicine | Admitting: Family Medicine

## 2019-01-18 ENCOUNTER — Ambulatory Visit (INDEPENDENT_AMBULATORY_CARE_PROVIDER_SITE_OTHER): Payer: Managed Care, Other (non HMO) | Admitting: Family Medicine

## 2019-01-18 ENCOUNTER — Encounter: Payer: Self-pay | Admitting: Family Medicine

## 2019-01-18 ENCOUNTER — Other Ambulatory Visit: Payer: Self-pay

## 2019-01-18 VITALS — BP 124/76 | HR 83 | Temp 97.8°F | Resp 18 | Ht 70.0 in | Wt 204.1 lb

## 2019-01-18 DIAGNOSIS — N200 Calculus of kidney: Secondary | ICD-10-CM

## 2019-01-18 DIAGNOSIS — R310 Gross hematuria: Secondary | ICD-10-CM | POA: Insufficient documentation

## 2019-01-18 DIAGNOSIS — F411 Generalized anxiety disorder: Secondary | ICD-10-CM

## 2019-01-18 LAB — POCT URINALYSIS DIPSTICK
Bilirubin, UA: NEGATIVE
Glucose, UA: NEGATIVE
Ketones, UA: NEGATIVE
Nitrite, UA: NEGATIVE
Protein, UA: POSITIVE — AB
Spec Grav, UA: 1.02 (ref 1.010–1.025)
Urobilinogen, UA: 0.2 E.U./dL
pH, UA: 5 (ref 5.0–8.0)

## 2019-01-18 MED ORDER — TRAMADOL HCL 50 MG PO TABS: 50.0000 mg | ORAL_TABLET | Freq: Four times a day (QID) | ORAL | 0 refills | Status: DC | PRN

## 2019-01-18 NOTE — Progress Notes (Signed)
Name: Adam GamblerChristopher B Palmer   MRN: [id number]    DOB: October 14, 1990   Date:01/18/2019       Progress Note  Subjective  Chief Complaint  Chief Complaint  Patient presents with  . Anxiety    follow up  . Flank Pain    possible kidneystones, noticed blood in urine    I connected with  Adam Gamblerhristopher B Mesler  on 01/18/19 at  7:20 AM EST by a video enabled telemedicine application and verified that I am speaking with the correct person using two identifiers.  I discussed the limitations of evaluation and management by telemedicine and the availability of in person appointments. The patient expressed understanding and agreed to proceed. Staff also discussed with the patient that there may be a patient responsible charge related to this service. Patient Location: Home Provider Location: Office Additional Individuals present: Home  HPI  Anxiety and Panic Attacks: Was switched off of effexor and onto trintellix - had headaches and nausea, and stopped Trintellix after 2 weeks due to this, restarted Effexor on his own for a few weeks, but it continued to cause low libido.  At last visit was switched to Lexapro - feeling like anxiety has been significantly improved, libido also improved.  Needed 1 xanax for 1 panic attack about 2 weeks ago.  Denies SI/HI.  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  Has history of recurrent nephrolithiasis and states this feels very similar to his usual episodes.  Adam FellersFrank hematuria started yesterday; he feels like he is going to pass another kidney stone.  He started taking flomax yesterday - when he does take this, the hematuria resolves.  Endorses mild nausea.  Denies fevers/chills, vomiting, fatigue/malaise.  Has been taking tylenol and ibuprofen sparingly, but this does not seem to help  the pain.  Patient Active Problem List   Diagnosis Date Noted  . Shifting sleep-work schedule 04/30/2018  . Mild persistent asthma without complication 04/30/2018  . Pure hypertriglyceridemia 10/29/2017  . Generalized anxiety disorder 10/29/2017  . Overweight (BMI 25.0-29.9) 10/29/2017  . Fatigue 10/29/2017  . Gross hematuria 04/29/2017  . Nephrolithiasis 04/29/2017    Past Surgical History:  Procedure Laterality Date  . APPENDECTOMY      Family History  Problem Relation Age of Onset  . Anxiety disorder Mother   . Anxiety disorder Father   . Kidney disease Neg Hx   . Prostate cancer Neg Hx   . Bladder Cancer Neg Hx   . Kidney cancer Neg Hx     Social History   Socioeconomic History  . Marital status: Married    Spouse name: Adam Palmer  . Number of children: 3  . Years of education: Not on file  . Highest education level: Not on file  Occupational History  . Not on file  Tobacco Use  . Smoking status: Former Smoker    Packs/day: 0.50    Years: 4.00    Pack years: 2.00  . Smokeless tobacco: Current User    Types: Chew  . Tobacco comment: quit 9 years  Substance and Sexual Activity  . Alcohol use: Yes  . Drug use: No  . Sexual activity: Yes    Partners: Female  Other Topics Concern  . Not on file  Social History Narrative   Married, Wife is Adam Palmer, has a  65 (son), 6 (son), and 30yo (daughter)   Social Determinants of Radio broadcast assistant Strain:   . Difficulty of Paying Living Expenses: Not on file  Food Insecurity:   . Worried About Charity fundraiser in the Last Year: Not on file  . Ran Out of Food in the Last Year: Not on file  Transportation Needs:   . Lack of Transportation (Medical): Not on file  . Lack of Transportation (Non-Medical): Not on file  Physical Activity: Sufficiently Active  . Days of Exercise per Week: 4 days  . Minutes of Exercise per Session: 60 min  Stress: Stress Concern Present  . Feeling of Stress : To some extent  Social  Connections: Unknown  . Frequency of Communication with Friends and Family: Not on file  . Frequency of Social Gatherings with Friends and Family: Once a week  . Attends Religious Services: Not on file  . Active Member of Clubs or Organizations: Not on file  . Attends Archivist Meetings: Not on file  . Marital Status: Not on file  Intimate Partner Violence:   . Fear of Current or Ex-Partner: Not on file  . Emotionally Abused: Not on file  . Physically Abused: Not on file  . Sexually Abused: Not on file     Current Outpatient Medications:  .  ALPRAZolam (XANAX) 0.25 MG tablet, Take 1 tablet (0.25 mg total) by mouth 2 (two) times daily as needed for anxiety. To last 1 year., Disp: 15 tablet, Rfl: 0 .  Armodafinil 50 MG tablet, Take 1 tablet (50 mg total) by mouth daily., Disp: 30 tablet, Rfl: 1 .  escitalopram (LEXAPRO) 10 MG tablet, TAKE 1/2 TABLET ONCE DAILY FOR 7 DAYS, THEN INCREASE TO 1 TABLET ONCE DAILY., Disp: 90 tablet, Rfl: 1 .  Icosapent Ethyl 1 g CAPS, Take 2 capsules (2 g total) by mouth 2 (two) times daily., Disp: 360 capsule, Rfl: 2 .  levocetirizine (XYZAL) 5 MG tablet, Take 1 tablet (5 mg total) by mouth every evening., Disp: 90 tablet, Rfl: 1 .  rosuvastatin (CRESTOR) 20 MG tablet, Take 1 tablet (20 mg total) by mouth daily., Disp: 90 tablet, Rfl: 3 .  tamsulosin (FLOMAX) 0.4 MG CAPS capsule, TAKE 1 CAPSULE BY MOUTH EVERY DAY, Disp: 90 capsule, Rfl: 0 .  traMADol (ULTRAM) 50 MG tablet, Take 1 tablet (50 mg total) by mouth every 6 (six) hours as needed for moderate pain or severe pain., Disp: 15 tablet, Rfl: 0  No Known Allergies  I personally reviewed active problem list, medication list, allergies, notes from last encounter, lab results with the patient/caregiver today.   ROS  Constitutional: Negative for fever or weight change.  Respiratory: Negative for cough and shortness of breath.   Cardiovascular: Negative for chest pain or palpitations.    Gastrointestinal: Negative for abdominal pain, no bowel changes.  Musculoskeletal: Negative for gait problem or joint swelling.  Skin: Negative for rash.  Neurological: Negative for dizziness or headache.  No other specific complaints in a complete review of systems (except as listed in HPI above).  Objective  Vitals:   01/18/19 0716  BP: 124/76  Pulse: 83  Resp: 18  Temp: 97.8 F (36.6 C)  SpO2: 96%    Body mass index is 29.29 kg/m.  Physical Exam  Constitutional: Patient appears well-developed and well-nourished. No distress.  HENT: Head: Normocephalic and atraumatic.  Neck: Normal range of motion. Pulmonary/Chest: Effort normal. No respiratory distress. Speaking in complete sentences Neurological:  Pt is alert and oriented to person, place, and time. Coordination, speech and gait are normal.  Psychiatric: Patient has a normal mood and affect. behavior is normal. Judgment and thought content normal.  Results for orders placed or performed in visit on 01/18/19 (from the past 72 hour(s))  POCT urinalysis dipstick     Status: Abnormal   Collection Time: 01/18/19  7:46 AM  Result Value Ref Range   Color, UA dark brown    Clarity, UA murky    Glucose, UA Negative Negative   Bilirubin, UA negative    Ketones, UA negative    Spec Grav, UA 1.020 1.010 - 1.025   Blood, UA large    pH, UA 5.0 5.0 - 8.0   Protein, UA Positive (A) Negative   Urobilinogen, UA 0.2 0.2 or 1.0 E.U./dL   Nitrite, UA negative    Leukocytes, UA Trace (A) Negative   Appearance murky    Odor none     PHQ2/9: Depression screen [facility] 2/9 01/18/2019 12/17/2018 11/24/2018 04/30/2018 02/22/2018  Decreased Interest 0 1 1 0 0  Down, Depressed, Hopeless 0 1 1 0 0  PHQ - 2 Score 0 2 2 0 0  Altered sleeping 0 3 3 0 0  Tired, decreased energy 0 1 1 0 0  Change in appetite 0 0 0 0 0  Feeling bad or failure about yourself  0 0 0 0 0  Trouble concentrating 0 0 0 0 0  Moving slowly or fidgety/restless 0 0 0 0 0   Suicidal thoughts 0 0 0 0 0  PHQ-9 Score 0 6 6 0 0  Difficult doing work/chores Not difficult at all Somewhat difficult Somewhat difficult Not difficult at all Not difficult at all   PHQ-2/9 Result is negative.    Fall Risk: Fall Risk  01/18/2019 12/17/2018 11/24/2018 04/30/2018 02/22/2018  Falls in the past year? 0 0 0 0 0  Number falls in past yr: 0 0 0 0 0  Injury with Fall? 0 0 0 0 0  Follow up Falls evaluation completed Falls evaluation completed Falls evaluation completed Falls evaluation completed -    Assessment & Plan  1. Generalized anxiety disorder - Doing very well on Lexapro - anxiety is much improved. Maintain 10mg  course.  2. Nephrolithiasis - Pt to come to office for urine sample and vital signs.  Likely passing another stone.  Will refer back to urology after labs and imaging completed. - Urine Culture - DG Abd 1 View; Future - traMADol (ULTRAM) 50 MG tablet; Take 1 tablet (50 mg total) by mouth every 6 (six) hours as needed for moderate pain or severe pain.  Dispense: 15 tablet; Refill: 0 - POCT urinalysis dipstick  3. hematuria - Urine Culture - DG Abd 1 View; Future - POCT urinalysis dipstick  I discussed the assessment and treatment plan with the patient. The patient was provided an opportunity to ask questions and all were answered. The patient agreed with the plan and demonstrated an understanding of the instructions.  The patient was advised to call back or seek an in-person evaluation if the symptoms worsen or if the condition fails to improve as anticipated.  I provided 16 minutes of non-face-to-face time during this encounter.

## 2019-01-19 ENCOUNTER — Other Ambulatory Visit: Payer: Self-pay | Admitting: Urology

## 2019-01-19 ENCOUNTER — Other Ambulatory Visit (HOSPITAL_COMMUNITY): Payer: Self-pay | Admitting: Urology

## 2019-01-19 ENCOUNTER — Telehealth: Payer: Self-pay | Admitting: Family Medicine

## 2019-01-19 DIAGNOSIS — N200 Calculus of kidney: Secondary | ICD-10-CM

## 2019-01-19 LAB — URINE CULTURE
MICRO NUMBER:: 1223241
Result:: NO GROWTH
SPECIMEN QUALITY:: ADEQUATE

## 2019-01-19 NOTE — Telephone Encounter (Signed)
-----   Message from Benard Halsted sent at 01/18/2019  4:42 PM EST ----- Regarding: FW: Appointment Can you try to reach him to see if he can come in on Tuesday to see Sam please?   thanks ----- Message ----- From: Abbie Sons, MD Sent: 01/18/2019   3:56 PM EST To: Gerhard Perches Subject: Appointment                                    This patient has a large stone in the renal pelvis.  Can we get him worked in tomorrow or next week?  He is established so he can see Larene Beach or Sam also

## 2019-01-19 NOTE — Telephone Encounter (Signed)
LMOM for patient to return cal. He needs an appointment scheduled with Sam on Tuesday.

## 2019-01-25 ENCOUNTER — Telehealth: Payer: Self-pay | Admitting: Urology

## 2019-01-25 NOTE — Telephone Encounter (Signed)
-----   Message from Abbie Sons, MD sent at 01/18/2019  3:56 PM EST ----- Regarding: Appointment This patient has a large stone in the renal pelvis.  Can we get him worked in tomorrow or next week?  He is established so he can see Larene Beach or Sam also

## 2019-01-25 NOTE — Telephone Encounter (Signed)
Attempted to reach patient 2 more times and phone goes straight to voicemail. Message was left for patient to contact our office to schedule appointment with Sam.

## 2019-01-25 NOTE — Telephone Encounter (Signed)
I TRIED REACHING THE PT BUT I NEVER COULD GET HIM. LOOKS LIKE HE WENT TO [place]   MICHELLE

## 2019-01-26 ENCOUNTER — Encounter (HOSPITAL_COMMUNITY): Payer: Self-pay

## 2019-01-26 NOTE — Patient Instructions (Addendum)
DUE TO COVID-19 ONLY ONE VISITOR IS ALLOWED TO COME WITH YOU AND STAY IN THE WAITING ROOM ONLY DURING PRE OP AND PROCEDURE DAY OF SURGERY.   THE 1 VISITOR MAY VISIT WITH YOU AFTER SURGERY IN YOUR PRIVATE ROOM DURING VISITING HOURS  8 AM-8 PM.   YOU NEED TO HAVE A COVID 19 TEST ON_12/31_____ :20______, Go to the cement overhang at [address] [facility] after you are finished with your lab draw  . It is okay if you are a little early or a little late. Then go home and recover and quarantine.   [address],  [place] , [address].  [facility])   ONCE YOUR COVID TEST IS COMPLETED, PLEASE BEGIN THE QUARANTINE INSTRUCTIONS AS OUTLINED IN YOUR HANDOUT.                Sherwood Gambler    Your procedure is scheduled on: 01/31/19   Report to [facility] Main  Entrance   Report to admitting at 10:00 AM     Call this number if you have problems the morning of surgery [phone]    Remember: Do not eat food or drink liquids after Midnight.   BRUSH YOUR TEETH MORNING OF SURGERY AND RINSE YOUR MOUTH OUT, NO CHEWING GUM CANDY OR MINTS.     Take these medicines the morning of surgery with A SIP OF WATER: Lexapro,xanax if needed                                 You may not have any metal on your body including                piercings  Do not wear jewelry, lotions, powders or  deodorant                         Men may shave face and neck.   Do not bring valuables to the hospital. [facility] IS NOT             RESPONSIBLE   FOR VALUABLES.  Contacts, dentures or bridgework may not be worn into surgery.  If you go home:    Patients discharged the day of surgery will not be allowed to drive home.  IF YOU ARE HAVING SURGERY AND GOING HOME THE SAME DAY, YOU MUST HAVE AN ADULT TO DRIVE YOU HOME AND BE WITH YOU FOR 24 HOURS.  YOU MAY GO HOME BY TAXI OR UBER OR ORTHERWISE, BUT AN ADULT MUST ACCOMPANY YOU HOME AND STAY WITH YOU FOR 24 HOURS.  Name and phone  number of your driver:  Special Instructions: N/A              Please read over the following fact sheets you were given: _____________________________________________________________________             [facility] - Preparing for Surgery  Before surgery, you can play an important role.   Because skin is not sterile, your skin needs to be as free of germs as possible.   You can reduce the number of germs on your skin by washing with CHG (chlorahexidine gluconate) soap before surgery.   CHG is an antiseptic cleaner which kills germs and bonds with the skin to continue killing germs even after washing. Please DO NOT use if you have an allergy to CHG or antibacterial soaps.  If your skin  becomes reddened/irritated stop using the CHG and inform your nurse when you arrive at Short Stay.  You may shave your face/neck.  Please follow these instructions carefully:  1.  Shower with CHG Soap the night before surgery and the  morning of Surgery.  2.  If you choose to wash your hair, wash your hair first as usual with your  normal  shampoo.  3.  After you shampoo, rinse your hair and body thoroughly to remove the  shampoo.                                        4.  Use CHG as you would any other liquid soap.  You can apply chg directly  to the skin and wash                       Gently with a scrungie or clean washcloth.  5.  Apply the CHG Soap to your body ONLY FROM THE NECK DOWN.   Do not use on face/ open                           Wound or open sores. Avoid contact with eyes, ears mouth and genitals (private parts).                       Wash face,  Genitals (private parts) with your normal soap.             6.  Wash thoroughly, paying special attention to the area where your surgery  will be performed.  7.  Thoroughly rinse your body with warm water from the neck down.  8.  DO NOT shower/wash with your normal soap after using and rinsing off  the CHG Soap.             9.  Pat yourself dry with  a clean towel.            10.  Wear clean pajamas.            11.  Place clean sheets on your bed the night of your first shower and do not  sleep with pets. Day of Surgery : Do not apply any lotions/deodorants the morning of surgery.  Please wear clean clothes to the hospital/surgery center.  FAILURE TO FOLLOW THESE INSTRUCTIONS MAY RESULT IN THE CANCELLATION OF YOUR SURGERY PATIENT SIGNATURE_________________________________  NURSE SIGNATURE__________________________________  ________________________________________________________________________  WHAT IS A BLOOD TRANSFUSION? Blood Transfusion Information  A transfusion is the replacement of blood or some of its parts. Blood is made up of multiple cells which provide different functions.  Red blood cells carry oxygen and are used for blood loss replacement.  White blood cells fight against infection.  Platelets control bleeding.  Plasma helps clot blood.  Other blood products are available for specialized needs, such as hemophilia or other clotting disorders. BEFORE THE TRANSFUSION  Who gives blood for transfusions?   Healthy volunteers who are fully evaluated to make sure their blood is safe. This is blood bank blood. Transfusion therapy is the safest it has ever been in the practice of medicine. Before blood is taken from a donor, a complete history is taken to make sure that person has no history of diseases nor engages in risky social behavior (examples are intravenous drug use or  sexual activity with multiple partners). The donor's travel history is screened to minimize risk of transmitting infections, such as malaria. The donated blood is tested for signs of infectious diseases, such as HIV and hepatitis. The blood is then tested to be sure it is compatible with you in order to minimize the chance of a transfusion reaction. If you or a relative donates blood, this is often done in anticipation of surgery and is not appropriate  for emergency situations. It takes many days to process the donated blood. RISKS AND COMPLICATIONS Although transfusion therapy is very safe and saves many lives, the main dangers of transfusion include:   Getting an infectious disease.  Developing a transfusion reaction. This is an allergic reaction to something in the blood you were given. Every precaution is taken to prevent this. The decision to have a blood transfusion has been considered carefully by your caregiver before blood is given. Blood is not given unless the benefits outweigh the risks. AFTER THE TRANSFUSION  Right after receiving a blood transfusion, you will usually feel much better and more energetic. This is especially true if your red blood cells have gotten low (anemic). The transfusion raises the level of the red blood cells which carry oxygen, and this usually causes an energy increase.  The nurse administering the transfusion will monitor you carefully for complications. HOME CARE INSTRUCTIONS  No special instructions are needed after a transfusion. You may find your energy is better. Speak with your caregiver about any limitations on activity for underlying diseases you may have. SEEK MEDICAL CARE IF:   Your condition is not improving after your transfusion.  You develop redness or irritation at the intravenous (IV) site. SEEK IMMEDIATE MEDICAL CARE IF:  Any of the following symptoms occur over the next 12 hours:  Shaking chills.  You have a temperature by mouth above 102 F (38.9 C), not controlled by medicine.  Chest, back, or muscle pain.  People around you feel you are not acting correctly or are confused.  Shortness of breath or difficulty breathing.  Dizziness and fainting.  You get a rash or develop hives.  You have a decrease in urine output.  Your urine turns a dark color or changes to pink, red, or brown. Any of the following symptoms occur over the next 10 days:  You have a temperature by  mouth above 102 F (38.9 C), not controlled by medicine.  Shortness of breath.  Weakness after normal activity.  The white part of the eye turns yellow (jaundice).  You have a decrease in the amount of urine or are urinating less often.  Your urine turns a dark color or changes to pink, red, or brown. Document Released: 01/11/2000 Document Revised: 04/07/2011 Document Reviewed: 08/30/2007 ExitCare Patient Information 2014 [place], [place].  _______________________________________________________________________  Incentive Spirometer  An incentive spirometer is a tool that can help keep your lungs clear and active. This tool measures how well you are filling your lungs with each breath. Taking long deep breaths may help reverse or decrease the chance of developing breathing (pulmonary) problems (especially infection) following:  A long period of time when you are unable to move or be active. BEFORE THE PROCEDURE   If the spirometer includes an indicator to show your best effort, your nurse or respiratory therapist will set it to a desired goal.  If possible, sit up straight or lean slightly forward. Try not to slouch.  Hold the incentive spirometer in an upright position. INSTRUCTIONS FOR USE  1. Sit on the edge of your bed if possible, or sit up as far as you can in bed or on a chair. 2. Hold the incentive spirometer in an upright position. 3. Breathe out normally. 4. Place the mouthpiece in your mouth and seal your lips tightly around it. 5. Breathe in slowly and as deeply as possible, raising the piston or the ball toward the top of the column. 6. Hold your breath for 3-5 seconds or for as long as possible. Allow the piston or ball to fall to the bottom of the column. 7. Remove the mouthpiece from your mouth and breathe out normally. 8. Rest for a few seconds and repeat Steps 1 through 7 at least 10 times every 1-2 hours when you are awake. Take your time and take a few normal  breaths between deep breaths. 9. The spirometer may include an indicator to show your best effort. Use the indicator as a goal to work toward during each repetition. 10. After each set of 10 deep breaths, practice coughing to be sure your lungs are clear. If you have an incision (the cut made at the time of surgery), support your incision when coughing by placing a pillow or rolled up towels firmly against it. Once you are able to get out of bed, walk around indoors and cough well. You may stop using the incentive spirometer when instructed by your caregiver.  RISKS AND COMPLICATIONS  Take your time so you do not get dizzy or light-headed.  If you are in pain, you may need to take or ask for pain medication before doing incentive spirometry. It is harder to take a deep breath if you are having pain. AFTER USE  Rest and breathe slowly and easily.  It can be helpful to keep track of a log of your progress. Your caregiver can provide you with a simple table to help with this. If you are using the spirometer at home, follow these instructions: SEEK MEDICAL CARE IF:   You are having difficultly using the spirometer.  You have trouble using the spirometer as often as instructed.  Your pain medication is not giving enough relief while using the spirometer.  You develop fever of 100.5 F (38.1 C) or higher. SEEK IMMEDIATE MEDICAL CARE IF:   You cough up bloody sputum that had not been present before.  You develop fever of 102 F (38.9 C) or greater.  You develop worsening pain at or near the incision site. MAKE SURE YOU:   Understand these instructions.  Will watch your condition.  Will get help right away if you are not doing well or get worse. Document Released: 05/26/2006 Document Revised: 04/07/2011 Document Reviewed: 07/27/2006 [facility] Patient Information 2014 [place], [place].   ________________________________________________________________________

## 2019-01-27 ENCOUNTER — Other Ambulatory Visit: Payer: Self-pay

## 2019-01-27 ENCOUNTER — Encounter (HOSPITAL_COMMUNITY)
Admission: RE | Admit: 2019-01-27 | Discharge: 2019-01-27 | Disposition: A | Discharge status: Home / Self Care | Payer: Managed Care, Other (non HMO) | Source: Ambulatory Visit | Attending: Urology | Admitting: Urology

## 2019-01-27 ENCOUNTER — Other Ambulatory Visit (HOSPITAL_COMMUNITY)
Admission: RE | Admit: 2019-01-27 | Discharge: 2019-01-27 | Disposition: A | Discharge status: Home / Self Care | Payer: Managed Care, Other (non HMO) | Source: Ambulatory Visit | Attending: Urology | Admitting: Urology

## 2019-01-27 ENCOUNTER — Encounter (HOSPITAL_COMMUNITY): Payer: Self-pay

## 2019-01-27 ENCOUNTER — Other Ambulatory Visit: Payer: Self-pay | Admitting: Student

## 2019-01-27 DIAGNOSIS — N202 Calculus of kidney with calculus of ureter: Secondary | ICD-10-CM | POA: Insufficient documentation

## 2019-01-27 DIAGNOSIS — Z20828 Contact with and (suspected) exposure to other viral communicable diseases: Secondary | ICD-10-CM | POA: Insufficient documentation

## 2019-01-27 DIAGNOSIS — Z01812 Encounter for preprocedural laboratory examination: Secondary | ICD-10-CM | POA: Insufficient documentation

## 2019-01-27 HISTORY — DX: Depression, unspecified: F32.A

## 2019-01-27 HISTORY — DX: Personal history of urinary calculi: Z87.442

## 2019-01-27 LAB — CBC
HCT: 47.1 % (ref 39.0–52.0)
Hemoglobin: 14.9 g/dL (ref 13.0–17.0)
MCH: 28.9 pg (ref 26.0–34.0)
MCHC: 31.6 g/dL (ref 30.0–36.0)
MCV: 91.5 fL (ref 80.0–100.0)
Platelets: 261 10*3/uL (ref 150–400)
RBC: 5.15 MIL/uL (ref 4.22–5.81)
RDW: 13.9 % (ref 11.5–15.5)
WBC: 8.9 10*3/uL (ref 4.0–10.5)
nRBC: 0 % (ref 0.0–0.2)

## 2019-01-27 LAB — BASIC METABOLIC PANEL
Anion gap: 8 (ref 5–15)
BUN: 15 mg/dL (ref 6–20)
CO2: 27 mmol/L (ref 22–32)
Calcium: 9.4 mg/dL (ref 8.9–10.3)
Chloride: 104 mmol/L (ref 98–111)
Creatinine, Ser: 0.87 mg/dL (ref 0.61–1.24)
GFR calc Af Amer: >60 mL/min (ref >60)
GFR calc non Af Amer: >60 mL/min (ref >60)
Glucose, Bld: 103 mg/dL — ABNORMAL HIGH (ref 70–99)
Potassium: 3.6 mmol/L (ref 3.5–5.1)
Sodium: 139 mmol/L (ref 135–145)

## 2019-01-27 LAB — ABO/RH: ABO/RH(D): O POS

## 2019-01-27 NOTE — Progress Notes (Signed)
PCP - [name] FNP Cardiologist - no  Chest x-ray - 02/25/18 EKG - no Stress Test - no ECHO - no Cardiac Cath - no  Sleep Study - no CPAP -   Fasting Blood Sugar - NA Checks Blood Sugar _____ times a day  Blood Thinner Instructions:NA Aspirin Instructions: Last Dose:  Anesthesia review:   Patient denies shortness of breath, fever, cough and chest pain at PAT appointment yes  Patient verbalized understanding of instructions that were given to them at the PAT appointment. Patient was also instructed that they will need to review over the PAT instructions again at home before surgery. yes

## 2019-01-28 LAB — NOVEL CORONAVIRUS, NAA (HOSP ORDER, SEND-OUT TO REF LAB; TAT 18-24 HRS): SARS-CoV-2, NAA: NOT DETECTED

## 2019-01-28 NOTE — H&P (Signed)
Office Visit Report     01/19/2019   --------------------------------------------------------------------------------   Adam Palmer  MRN: [id number]  DOB: Oct 20, 1990, 29 year old Male  SSN:    PRIMARY CARE:    REFERRING:  MEDICAID [facility]  PROVIDER:  Heloise Purpura, M.D.  LOCATION:  [facility] [phone]     --------------------------------------------------------------------------------   CC/HPI: Left renal calculus   Adam Palmer is a 29 year old relatively healthy patient with a history of recurrent urolithiasis. He has passed 2 stones previously. He had last been evaluated urologically in [place] in 2019 by Dr. Lonna Cobb. At that time, he had developed gross hematuria and a CT stone study had demonstrated a 9 mm nonobstructing stone. He had undergone cystoscopy that was unremarkable. He was scheduled for follow-up and recommended to complete a metabolic evaluation but did not follow-up as recommended. Most recently, his acute symptoms began 4 days ago when he developed left-sided flank pain with radiation to his left upper quadrant. This was associated with some mild nausea. He presented to his primary care provider who ordered a KUB x-ray. This demonstrated a calcification measuring 1.9 x 1.8 cm in the left renal pelvis. No other concerning calcifications were noted to suggest ureteral calculi. He has denied any fever. He continues to have fairly persistent pain and has been using tramadol on an as-needed basis to manage this.     ALLERGIES: NKDA    MEDICATIONS: Flomax 0.4 mg capsule  Lexapro 10 mg tablet  Rouvastatin  Tramadol Hcl 50 mg tablet  Vasepa     GU PSH: None   NON-GU PSH: Appendectomy     GU PMH: None   NON-GU PMH: Anxiety Hypercholesterolemia    FAMILY HISTORY: 1 Daughter - Daughter 2 sons - Son Kidney Stones - Father   SOCIAL HISTORY: Marital Status: Married Preferred Language: English; Ethnicity: Not Hispanic Or  Latino; Race: White Current Smoking Status: Patient has never smoked.   Tobacco Use Assessment Completed: Used Tobacco in last 30 days? Has never drank.  Drinks 3 caffeinated drinks per day.    REVIEW OF SYSTEMS:    GU Review Male:   Patient reports burning/ pain with urination and have to strain to urinate . Patient denies frequent urination, hard to postpone urination, get up at night to urinate, leakage of urine, stream starts and stops, and trouble starting your streams.  Gastrointestinal (Upper):   Patient denies nausea and vomiting.  Gastrointestinal (Lower):   Patient denies diarrhea and constipation.  Constitutional:   Patient denies fever, night sweats, weight loss, and fatigue.  Skin:   Patient denies skin rash/ lesion and itching.  Eyes:   Patient denies double vision and blurred vision.  Ears/ Nose/ Throat:   Patient denies sore throat and sinus problems.  Hematologic/Lymphatic:   Patient denies swollen glands and easy bruising.  Cardiovascular:   Patient denies leg swelling and chest pains.  Respiratory:   Patient denies cough and shortness of breath.  Endocrine:   Patient denies excessive thirst.  Musculoskeletal:   Patient denies back pain and joint pain.  Neurological:   Patient denies headaches and dizziness.  Psychologic:   Patient denies depression and anxiety.   VITAL SIGNS:      01/19/2019 11:24 AM  Weight 198 lb / 89.81 kg  Height 70 in / 177.8 cm  BP 102/65 mmHg  Pulse 76 /min  Temperature 97.9 F / 36.6 C  BMI 28.4 kg/m   MULTI-SYSTEM PHYSICAL EXAMINATION:    Constitutional: Well-nourished.  No physical deformities. Normally developed. Good grooming.  Neck: Neck symmetrical, not swollen. Normal tracheal position.  Respiratory: No labored breathing, no use of accessory muscles. Clear bilaterally  Cardiovascular: Normal temperature, normal extremity pulses, no swelling, no varicosities. Regular rate and rhythm.  Lymphatic: No enlargement of neck, axillae,  groin.  Skin: No paleness, no jaundice, no cyanosis. No lesion, no ulcer, no rash.  Neurologic / Psychiatric: Oriented to time, oriented to place, oriented to person. No depression, no anxiety, no agitation.  Gastrointestinal: Moderate left CVA tenderness and mild left upper quadrant tenderness.  Eyes: Normal conjunctivae. Normal eyelids.  Ears, Nose, Mouth, and Throat: Left ear no scars, no lesions, no masses. Right ear no scars, no lesions, no masses. Nose no scars, no lesions, no masses. Normal hearing. Normal lips.  Musculoskeletal: Normal gait and station of head and neck.     PAST DATA REVIEWED:  Source Of History:  Patient  X-Ray Review: KUB: Reviewed Films.  C.T. Abdomen/Pelvis: Reviewed Films.    Notes:                     CLINICAL DATA: Hematuria   EXAM:  ABDOMEN - 1 VIEW   COMPARISON: May 18, 2017   FINDINGS:  There is a focal calculus at the level of the left renal pelvis  measuring 1.9 x 1.8 cm. There is a calcification in the lower left  pelvis which is stable and likely represents a phlebolith. There is  moderate stool in the colon. There is no bowel dilatation or  air-fluid level to suggest bowel obstruction. No free air.   IMPRESSION:  1.9 x 1.8 cm calculus at the level of the left renal pelvis.  Apparent small phlebolith in the lower left pelvis. Moderate stool  in colon. No bowel obstruction or free air evident.   These results will be called to the ordering clinician or  representative by the Radiologist Assistant, and communication  documented in the PACS or zVision Dashboard.    Electronically Signed  By: Lowella Grip III M.D.  On: 01/18/2019 08:11   CLINICAL DATA: Hematuria on and off for the past month. Left flank  pain.   EXAM:  CT ABDOMEN AND PELVIS WITHOUT AND WITH CONTRAST   TECHNIQUE:  Multidetector CT imaging of the abdomen and pelvis was performed  following the standard protocol before and following the bolus  administration of  intravenous contrast.   CONTRAST: 129mL ISOVUE-300 IOPAMIDOL (ISOVUE-300) INJECTION 61%   COMPARISON: CT scan 09/09/2015   FINDINGS:  Lower chest: The lung bases are clear of acute process. No pleural  effusion or pulmonary lesions. The heart is normal in size. No  pericardial effusion. The distal esophagus and aorta are  unremarkable.   Hepatobiliary: No focal hepatic lesions or intrahepatic biliary  dilatation. The gallbladder is normal. No common bile duct  dilatation.   Pancreas: No mass, inflammation or ductal dilatation.   Spleen: Normal size. No focal lesions.   Adrenals/Urinary Tract: The adrenal glands are unremarkable and  stable.   Stable 9 mm lower pole left renal calculus. A few other tiny  scattered calculi are noted. No obstructing ureteral calculi or  bladder calculi. Both kidneys demonstrate normal  enhancement/perfusion. No worrisome renal lesions. No significant  collecting system abnormalities. Both ureters are normal. The  bladder is normal.   Stomach/Bowel: The stomach, duodenum, small bowel and colon are  grossly normal without oral contrast. No inflammatory changes, mass  lesions or obstructive findings. The terminal  ileum is normal. The  appendix is surgically absent.   Vascular/Lymphatic: The aorta is normal in caliber. No dissection.  The branch vessels are patent. The major venous structures are  patent. No mesenteric or retroperitoneal mass or adenopathy. Small  scattered lymph nodes are noted.   Reproductive: The prostate gland and seminal vesicles are  unremarkable.   Other: No pelvic mass or adenopathy. No free pelvic fluid  collections. No inguinal mass or adenopathy or hernia. No abdominal  wall hernia or subcutaneous lesions.   Musculoskeletal: No significant bony findings.   IMPRESSION:  1. Stable 9 mm lower pole left renal calculus likely accounting for  the patient's hematuria.  2. No obstructing ureteral calculi or bladder  calculi. No renal or  bladder mass.  3. No acute abdominal/pelvic findings, mass lesions or  lymphadenopathy.    Electronically Signed  By: Rudie Meyer M.D.  On: 04/27/2017 14:55   PROCEDURES:         C.T. Urogram - [id number]      Patient confirmed No Neulasta OnPro Device.           Urinalysis w/Scope Dipstick Dipstick Cont'd Micro  Color: Amber Bilirubin: Neg mg/dL WBC/hpf: 0 - 5/hpf  Appearance: Clear Ketones: Neg mg/dL RBC/hpf: 20 - 27/OZD  Specific Gravity: 1.020 Blood: 2+ ery/uL Bacteria: NS (Not Seen)  pH: 6.0 Protein: 1+ mg/dL Cystals: NS (Not Seen)  Glucose: Neg mg/dL Urobilinogen: 0.2 mg/dL Casts: NS (Not Seen)    Nitrites: Neg Trichomonas: Not Present    Leukocyte Esterase: Neg leu/uL Mucous: Not Present      Epithelial Cells: NS (Not Seen)      Yeast: NS (Not Seen)      Sperm: Not Present    ASSESSMENT:      ICD-10 Details  1 GU:   Renal calculus - N20.0    PLAN:            Medications New Meds: Oxycodone-Acetaminophen 5 mg-325 mg tablet 1-2 tablet PO Q 6 H prn   #20  0 Refill(s)            Orders Labs BMP  X-Rays: C.T. Stone Protocol Without Contrast  X-Ray Notes: History:  Hematuria: Yes/No  Patient to see MD after exam: Yes/No  Previous exam: CT / IVP/ US/ KUB/ None  When:  Where:  Diabetic: Yes/ No  BUN/ Creatinine:  Date of last BUN Creatinine:  Weight in pounds:  Allergy- IV Contrast: Yes/ No  Conflicting diabetic meds: Yes/ No  Diabetic Meds:  Prior Authorization #: [id number]           Schedule Return Visit/Planned Activity: Other See Visit Notes             Note: Will call to schedule surgery          Document Letter(s):  Created for Patient: Clinical Summary         Notes:   1. Left renal calculus: We had a detailed discussion regarding what appears to be a large left renal calculus that is symptomatic. We reviewed options for treatment including shockwave lithotripsy versus ureteroscopic laser lithotripsy versus  percutaneous nephrolithotomy and reviewed the pros and cons of each of these approaches. He understands that he will not pass this stone. He was told that he can stop tamsulosin as medical expulsion therapy is not an option here. After reviewing all options and the advantages and disadvantages of each, he expressed most interest in a percutaneous nephrolithotomy with his goal of trying  to proceed with definitive therapy in 1 procedure with a relatively quick recovery. As such, he underwent CT imaging today for further definitive evaluation. This demonstrates a solitary 1.8 cm stone in the left renal pelvis without other evidence of ureteral or renal calculi.   After further discussion, he does wish to proceed with left percutaneous nephrolithotomy for definitive management nor to recover his quickly as he can and not require additional treatment or require a stent. We have reviewed the potential risks and complications of this procedure including but not limited to injury or damage to adjacent structures such as the colon, lung, spleen, etc. We have discussed the risks of bleeding or injury damage to the kidney, the risks of infection, the potential need for additional procedures. We also discussed the expected recovery process. He gives informed consent to proceed. This will be scheduled. He has been provided a prescription for oxycodone should he have worsening pain that is not controlled.   He will call should he develop fever, uncontrolled pain, or persistent nausea/vomiting in the meantime.   Cc: Maurice Small, NP        Next Appointment:      Next Appointment: 01/31/2019 12:00 PM    Appointment Type: Surgery     Location: [facility] [phone]    Provider: Heloise Purpura, M.D.    Reason for Visit: [facility]/EXT REC LEFT PCNL      * Signed by Heloise Purpura, M.D. on 01/19/19 at 6:10 PM (EST)*

## 2019-01-31 ENCOUNTER — Inpatient Hospital Stay (HOSPITAL_COMMUNITY): Admission: RE | Admit: 2019-01-31 | Payer: Managed Care, Other (non HMO) | Source: Ambulatory Visit

## 2019-01-31 ENCOUNTER — Ambulatory Visit (HOSPITAL_COMMUNITY)
Admission: RE | Admit: 2019-01-31 | Discharge: 2019-01-31 | Disposition: A | Discharge status: Home / Self Care | Payer: Managed Care, Other (non HMO) | Source: Ambulatory Visit | Attending: Urology | Admitting: Urology

## 2019-01-31 ENCOUNTER — Ambulatory Visit (HOSPITAL_COMMUNITY): Payer: Managed Care, Other (non HMO) | Admitting: Physician Assistant

## 2019-01-31 ENCOUNTER — Encounter (HOSPITAL_COMMUNITY)
Admission: RE | Disposition: A | Discharge status: Home / Self Care | Payer: Self-pay | Source: Ambulatory Visit | Attending: Urology

## 2019-01-31 ENCOUNTER — Encounter (HOSPITAL_COMMUNITY): Payer: Self-pay | Admitting: Urology

## 2019-01-31 ENCOUNTER — Other Ambulatory Visit: Payer: Self-pay

## 2019-01-31 ENCOUNTER — Ambulatory Visit (HOSPITAL_COMMUNITY): Payer: Managed Care, Other (non HMO)

## 2019-01-31 ENCOUNTER — Ambulatory Visit (HOSPITAL_COMMUNITY)
Admission: RE | Admit: 2019-01-31 | Discharge: 2019-02-01 | Disposition: A | Discharge status: Home / Self Care | Payer: Managed Care, Other (non HMO) | Source: Ambulatory Visit | Attending: Urology | Admitting: Urology

## 2019-01-31 ENCOUNTER — Encounter (HOSPITAL_COMMUNITY): Payer: Self-pay

## 2019-01-31 ENCOUNTER — Ambulatory Visit (HOSPITAL_COMMUNITY): Payer: Managed Care, Other (non HMO) | Admitting: Certified Registered Nurse Anesthetist

## 2019-01-31 DIAGNOSIS — F419 Anxiety disorder, unspecified: Secondary | ICD-10-CM | POA: Insufficient documentation

## 2019-01-31 DIAGNOSIS — E7800 Pure hypercholesterolemia, unspecified: Secondary | ICD-10-CM | POA: Insufficient documentation

## 2019-01-31 DIAGNOSIS — Z79899 Other long term (current) drug therapy: Secondary | ICD-10-CM | POA: Insufficient documentation

## 2019-01-31 DIAGNOSIS — N200 Calculus of kidney: Secondary | ICD-10-CM | POA: Diagnosis present

## 2019-01-31 HISTORY — PX: IR URETERAL STENT LEFT NEW ACCESS W/O SEP NEPHROSTOMY CATH: IMG6075

## 2019-01-31 HISTORY — PX: NEPHROLITHOTOMY: SHX5134

## 2019-01-31 LAB — CBC
HCT: 46.9 % (ref 39.0–52.0)
Hemoglobin: 15.3 g/dL (ref 13.0–17.0)
MCH: 29.5 pg (ref 26.0–34.0)
MCHC: 32.6 g/dL (ref 30.0–36.0)
MCV: 90.4 fL (ref 80.0–100.0)
Platelets: 250 10*3/uL (ref 150–400)
RBC: 5.19 MIL/uL (ref 4.22–5.81)
RDW: 14 % (ref 11.5–15.5)
WBC: 7.9 10*3/uL (ref 4.0–10.5)
nRBC: 0 % (ref 0.0–0.2)

## 2019-01-31 LAB — BASIC METABOLIC PANEL
Anion gap: 8 (ref 5–15)
BUN: 13 mg/dL (ref 6–20)
CO2: 27 mmol/L (ref 22–32)
Calcium: 9.9 mg/dL (ref 8.9–10.3)
Chloride: 104 mmol/L (ref 98–111)
Creatinine, Ser: 0.84 mg/dL (ref 0.61–1.24)
GFR calc Af Amer: >60 mL/min (ref >60)
GFR calc non Af Amer: >60 mL/min (ref >60)
Glucose, Bld: 96 mg/dL (ref 70–99)
Potassium: 4.2 mmol/L (ref 3.5–5.1)
Sodium: 139 mmol/L (ref 135–145)

## 2019-01-31 LAB — TYPE AND SCREEN
ABO/RH(D): O POS
Antibody Screen: NEGATIVE

## 2019-01-31 LAB — PROTIME-INR
INR: 1 (ref 0.8–1.2)
Prothrombin Time: 13.3 seconds (ref 11.4–15.2)

## 2019-01-31 SURGERY — NEPHROLITHOTOMY PERCUTANEOUS
Anesthesia: General | Laterality: Left

## 2019-01-31 MED ORDER — DEXAMETHASONE SODIUM PHOSPHATE 10 MG/ML IJ SOLN
INTRAMUSCULAR | Status: DC | PRN
  Administered 2019-01-31: 10 mg via INTRAVENOUS

## 2019-01-31 MED ORDER — OXYCODONE HCL 5 MG PO TABS
ORAL_TABLET | ORAL | Status: AC
  Administered 2019-01-31: 5 mg via ORAL
  Filled 2019-01-31: qty 1

## 2019-01-31 MED ORDER — OXYCODONE HCL 5 MG PO TABS
ORAL_TABLET | ORAL | Status: AC
  Filled 2019-01-31: qty 1

## 2019-01-31 MED ORDER — LIDOCAINE HCL (PF) 1 % IJ SOLN
INTRAMUSCULAR | Status: AC | PRN
  Administered 2019-01-31 (×4): 5 mL

## 2019-01-31 MED ORDER — ONDANSETRON HCL 4 MG/2ML IJ SOLN: 4.0000 mg | INTRAMUSCULAR | Status: DC | PRN

## 2019-01-31 MED ORDER — MIDAZOLAM HCL 5 MG/5ML IJ SOLN
INTRAMUSCULAR | Status: DC | PRN
  Administered 2019-01-31: 2 mg via INTRAVENOUS

## 2019-01-31 MED ORDER — SUGAMMADEX SODIUM 200 MG/2ML IV SOLN
INTRAVENOUS | Status: DC | PRN
  Administered 2019-01-31: 200 mg via INTRAVENOUS

## 2019-01-31 MED ORDER — FENTANYL CITRATE (PF) 100 MCG/2ML IJ SOLN
INTRAMUSCULAR | Status: AC
  Administered 2019-01-31: 50 ug via INTRAVENOUS
  Filled 2019-01-31: qty 2

## 2019-01-31 MED ORDER — ONDANSETRON HCL 4 MG/2ML IJ SOLN
INTRAMUSCULAR | Status: AC
  Filled 2019-01-31: qty 2

## 2019-01-31 MED ORDER — LIDOCAINE HCL 1 % IJ SOLN
INTRAMUSCULAR | Status: AC
  Filled 2019-01-31: qty 20

## 2019-01-31 MED ORDER — LIDOCAINE 2% (20 MG/ML) 5 ML SYRINGE
INTRAMUSCULAR | Status: AC
  Filled 2019-01-31: qty 5

## 2019-01-31 MED ORDER — ROCURONIUM BROMIDE 10 MG/ML (PF) SYRINGE
PREFILLED_SYRINGE | INTRAVENOUS | Status: AC
  Filled 2019-01-31: qty 10

## 2019-01-31 MED ORDER — FENTANYL CITRATE (PF) 250 MCG/5ML IJ SOLN
INTRAMUSCULAR | Status: AC
  Filled 2019-01-31: qty 5

## 2019-01-31 MED ORDER — IOHEXOL 300 MG/ML  SOLN
50.0000 mL | Freq: Once | INTRAMUSCULAR | Status: AC | PRN
  Administered 2019-01-31: 11:00:00 20 mL

## 2019-01-31 MED ORDER — MORPHINE SULFATE (PF) 2 MG/ML IV SOLN
2.0000 mg | INTRAVENOUS | Status: DC | PRN
  Administered 2019-01-31 – 2019-02-01 (×2): 2 mg via INTRAVENOUS
  Administered 2019-02-01: 4 mg via INTRAVENOUS
  Administered 2019-02-01: 2 mg via INTRAVENOUS
  Filled 2019-01-31 (×5): qty 1

## 2019-01-31 MED ORDER — FENTANYL CITRATE (PF) 100 MCG/2ML IJ SOLN
INTRAMUSCULAR | Status: DC | PRN
  Administered 2019-01-31: 50 ug via INTRAVENOUS
  Administered 2019-01-31: 100 ug via INTRAVENOUS

## 2019-01-31 MED ORDER — DIPHENHYDRAMINE HCL 12.5 MG/5ML PO ELIX: 12.5000 mg | ORAL_SOLUTION | Freq: Four times a day (QID) | ORAL | Status: DC | PRN

## 2019-01-31 MED ORDER — LORATADINE 10 MG PO TABS
10.0000 mg | ORAL_TABLET | Freq: Every day | ORAL | Status: DC
  Administered 2019-02-01: 10 mg via ORAL
  Filled 2019-01-31: qty 1

## 2019-01-31 MED ORDER — OXYCODONE HCL 5 MG PO TABS
5.0000 mg | ORAL_TABLET | ORAL | Status: DC | PRN
  Administered 2019-01-31 – 2019-02-01 (×4): 5 mg via ORAL
  Filled 2019-01-31 (×4): qty 1

## 2019-01-31 MED ORDER — SODIUM CHLORIDE 0.9 % IR SOLN
Status: DC | PRN
  Administered 2019-01-31: 6000 mL

## 2019-01-31 MED ORDER — SENNOSIDES-DOCUSATE SODIUM 8.6-50 MG PO TABS
2.0000 | ORAL_TABLET | Freq: Every day | ORAL | Status: DC
  Administered 2019-01-31: 2 via ORAL
  Filled 2019-01-31: qty 2

## 2019-01-31 MED ORDER — ALPRAZOLAM 0.25 MG PO TABS: 0.2500 mg | ORAL_TABLET | Freq: Every day | ORAL | Status: DC | PRN

## 2019-01-31 MED ORDER — CEFAZOLIN SODIUM-DEXTROSE 1-4 GM/50ML-% IV SOLN
1.0000 g | Freq: Three times a day (TID) | INTRAVENOUS | Status: DC
  Administered 2019-01-31 – 2019-02-01 (×3): 1 g via INTRAVENOUS
  Filled 2019-01-31 (×4): qty 50

## 2019-01-31 MED ORDER — PROPOFOL 10 MG/ML IV BOLUS
INTRAVENOUS | Status: AC
  Filled 2019-01-31: qty 20

## 2019-01-31 MED ORDER — OXYBUTYNIN CHLORIDE 5 MG PO TABS
ORAL_TABLET | ORAL | Status: AC
  Administered 2019-01-31: 5 mg
  Filled 2019-01-31: qty 1

## 2019-01-31 MED ORDER — DIPHENHYDRAMINE HCL 50 MG/ML IJ SOLN: 12.5000 mg | Freq: Four times a day (QID) | INTRAMUSCULAR | Status: DC | PRN

## 2019-01-31 MED ORDER — SODIUM CHLORIDE 0.9 % IV SOLN
2.0000 g | Freq: Once | INTRAVENOUS | Status: AC
  Administered 2019-01-31: 2 g via INTRAVENOUS
  Filled 2019-01-31: qty 20

## 2019-01-31 MED ORDER — OXYBUTYNIN CHLORIDE 5 MG PO TABS
5.0000 mg | ORAL_TABLET | Freq: Three times a day (TID) | ORAL | Status: DC | PRN
  Administered 2019-01-31: 5 mg via ORAL
  Filled 2019-01-31: qty 1

## 2019-01-31 MED ORDER — ROSUVASTATIN CALCIUM 20 MG PO TABS
20.0000 mg | ORAL_TABLET | Freq: Every day | ORAL | Status: DC
  Administered 2019-02-01: 20 mg via ORAL
  Filled 2019-01-31: qty 1

## 2019-01-31 MED ORDER — ROCURONIUM BROMIDE 10 MG/ML (PF) SYRINGE
PREFILLED_SYRINGE | INTRAVENOUS | Status: DC | PRN
  Administered 2019-01-31: 60 mg via INTRAVENOUS

## 2019-01-31 MED ORDER — ACETAMINOPHEN 10 MG/ML IV SOLN: 1000.0000 mg | Freq: Once | INTRAVENOUS | Status: AC

## 2019-01-31 MED ORDER — ONDANSETRON HCL 4 MG/2ML IJ SOLN: 4.0000 mg | Freq: Once | INTRAMUSCULAR | Status: DC | PRN

## 2019-01-31 MED ORDER — FENTANYL CITRATE (PF) 100 MCG/2ML IJ SOLN
INTRAMUSCULAR | Status: AC | PRN
  Administered 2019-01-31 (×2): 50 ug via INTRAVENOUS

## 2019-01-31 MED ORDER — IOHEXOL 300 MG/ML  SOLN
INTRAMUSCULAR | Status: DC | PRN
  Administered 2019-01-31: 13:00:00 20 mL

## 2019-01-31 MED ORDER — ONDANSETRON HCL 4 MG/2ML IJ SOLN
INTRAMUSCULAR | Status: DC | PRN
  Administered 2019-01-31: 4 mg via INTRAVENOUS

## 2019-01-31 MED ORDER — CIPROFLOXACIN IN D5W 400 MG/200ML IV SOLN
400.0000 mg | Freq: Once | INTRAVENOUS | Status: AC
  Administered 2019-01-31: 400 mg via INTRAVENOUS
  Filled 2019-01-31: qty 200

## 2019-01-31 MED ORDER — OXYCODONE HCL 5 MG PO TABS: 5.0000 mg | ORAL_TABLET | Freq: Once | ORAL | Status: DC

## 2019-01-31 MED ORDER — OXYCODONE HCL 5 MG PO TABS: 5.0000 mg | ORAL_TABLET | Freq: Once | ORAL | Status: AC | PRN

## 2019-01-31 MED ORDER — LEVOCETIRIZINE DIHYDROCHLORIDE 5 MG PO TABS: 5.0000 mg | ORAL_TABLET | Freq: Every evening | ORAL | Status: DC

## 2019-01-31 MED ORDER — SODIUM CHLORIDE 0.9 % IV SOLN: INTRAVENOUS | Status: DC

## 2019-01-31 MED ORDER — MIDAZOLAM HCL 2 MG/2ML IJ SOLN
INTRAMUSCULAR | Status: AC | PRN
  Administered 2019-01-31 (×4): 1 mg via INTRAVENOUS

## 2019-01-31 MED ORDER — ESCITALOPRAM OXALATE 10 MG PO TABS
10.0000 mg | ORAL_TABLET | Freq: Every day | ORAL | Status: DC
  Administered 2019-02-01: 10 mg via ORAL
  Filled 2019-01-31: qty 1

## 2019-01-31 MED ORDER — FENTANYL CITRATE (PF) 100 MCG/2ML IJ SOLN
INTRAMUSCULAR | Status: AC
  Filled 2019-01-31: qty 4

## 2019-01-31 MED ORDER — MIDAZOLAM HCL 2 MG/2ML IJ SOLN
INTRAMUSCULAR | Status: AC
  Filled 2019-01-31: qty 6

## 2019-01-31 MED ORDER — ONDANSETRON HCL 4 MG/2ML IJ SOLN
INTRAMUSCULAR | Status: AC | PRN
  Administered 2019-01-31: 4 mg via INTRAVENOUS

## 2019-01-31 MED ORDER — OXYCODONE HCL 5 MG/5ML PO SOLN: 5.0000 mg | Freq: Once | ORAL | Status: AC | PRN

## 2019-01-31 MED ORDER — MIDAZOLAM HCL 2 MG/2ML IJ SOLN
INTRAMUSCULAR | Status: AC
  Filled 2019-01-31: qty 2

## 2019-01-31 MED ORDER — PROPOFOL 10 MG/ML IV BOLUS
INTRAVENOUS | Status: DC | PRN
  Administered 2019-01-31: 150 mg via INTRAVENOUS

## 2019-01-31 MED ORDER — ACETAMINOPHEN 500 MG PO TABS
1000.0000 mg | ORAL_TABLET | Freq: Four times a day (QID) | ORAL | Status: DC
  Administered 2019-01-31 – 2019-02-01 (×3): 1000 mg via ORAL
  Filled 2019-01-31 (×3): qty 2

## 2019-01-31 MED ORDER — ACETAMINOPHEN 10 MG/ML IV SOLN
INTRAVENOUS | Status: AC
  Administered 2019-01-31: 1000 mg via INTRAVENOUS
  Filled 2019-01-31: qty 100

## 2019-01-31 MED ORDER — FENTANYL CITRATE (PF) 100 MCG/2ML IJ SOLN
25.0000 ug | INTRAMUSCULAR | Status: DC | PRN
  Administered 2019-01-31: 50 ug via INTRAVENOUS

## 2019-01-31 MED ORDER — LACTATED RINGERS IV SOLN: INTRAVENOUS | Status: DC | PRN

## 2019-01-31 MED ORDER — DEXAMETHASONE SODIUM PHOSPHATE 10 MG/ML IJ SOLN
INTRAMUSCULAR | Status: AC
  Filled 2019-01-31: qty 1

## 2019-01-31 SURGICAL SUPPLY — 47 items
BAG URINE DRAIN 2000ML AR STRL (UROLOGICAL SUPPLIES) ×2 IMPLANT
BASKET LASER NITINOL 1.9FR (BASKET) IMPLANT
BASKET STONE NITINOL 3FRX115MB (UROLOGICAL SUPPLIES) IMPLANT
BENZOIN TINCTURE PRP APPL 2/3 (GAUZE/BANDAGES/DRESSINGS) ×2 IMPLANT
BLADE SURG SZ10 CARB STEEL (BLADE) ×2 IMPLANT
BOOTIES KNEE HIGH SLOAN (MISCELLANEOUS) ×6 IMPLANT
CATH FOLEY 2W COUNCIL 20FR 5CC (CATHETERS) ×2 IMPLANT
CATH IMAGER II 65CM (CATHETERS) IMPLANT
CATH ROBINSON RED A/P 20FR (CATHETERS) IMPLANT
CATH URET DUAL LUMEN 6-10FR 50 (CATHETERS) ×2 IMPLANT
CATH UROLOGY TORQUE 40 (MISCELLANEOUS) ×2 IMPLANT
CATH X-FORCE N30 NEPHROSTOMY (TUBING) ×2 IMPLANT
CHLORAPREP W/TINT 26 (MISCELLANEOUS) ×2 IMPLANT
COVER SURGICAL LIGHT HANDLE (MISCELLANEOUS) ×2 IMPLANT
COVER WAND RF STERILE (DRAPES) IMPLANT
DRAPE C-ARM 42X120 X-RAY (DRAPES) ×2 IMPLANT
DRAPE LINGEMAN PERC (DRAPES) ×2 IMPLANT
DRAPE SURG IRRIG POUCH 19X23 (DRAPES) ×2 IMPLANT
DRSG PAD ABDOMINAL 8X10 ST (GAUZE/BANDAGES/DRESSINGS) ×4 IMPLANT
DRSG TEGADERM 8X12 (GAUZE/BANDAGES/DRESSINGS) ×4 IMPLANT
FIBER LASER FLEXIVA 365 (UROLOGICAL SUPPLIES) IMPLANT
FIBER LASER TRAC TIP (UROLOGICAL SUPPLIES) IMPLANT
GAUZE SPONGE 4X4 12PLY STRL (GAUZE/BANDAGES/DRESSINGS) ×2 IMPLANT
GLOVE BIOGEL M STRL SZ7.5 (GLOVE) ×4 IMPLANT
GOWN STRL REUS W/TWL LRG LVL3 (GOWN DISPOSABLE) ×6 IMPLANT
GUIDEWIRE AMPLAZ .035X145 (WIRE) ×4 IMPLANT
GUIDEWIRE STR DUAL SENSOR (WIRE) IMPLANT
KIT BASIN OR (CUSTOM PROCEDURE TRAY) ×2 IMPLANT
KIT PROBE 340X3.4XDISP GRN (MISCELLANEOUS) IMPLANT
KIT PROBE TRILOGY 3.4X340 (MISCELLANEOUS)
KIT PROBE TRILOGY 3.9X350 (MISCELLANEOUS) ×2 IMPLANT
KIT TURNOVER KIT A (KITS) IMPLANT
MANIFOLD NEPTUNE II (INSTRUMENTS) ×2 IMPLANT
NS IRRIG 1000ML POUR BTL (IV SOLUTION) ×2 IMPLANT
PACK CYSTO (CUSTOM PROCEDURE TRAY) ×2 IMPLANT
SPONGE LAP 4X18 RFD (DISPOSABLE) ×2 IMPLANT
STENT ENDOURETEROTOMY 7-14 26C (STENTS) IMPLANT
SUT SILK 2 0 30  PSL (SUTURE) ×1
SUT SILK 2 0 30 PSL (SUTURE) ×1 IMPLANT
SYR 20ML LL LF (SYRINGE) ×2 IMPLANT
SYSTEM UROSTOMY GENTLE TOUCH (WOUND CARE) ×2 IMPLANT
TOWEL OR 17X26 10 PK STRL BLUE (TOWEL DISPOSABLE) ×2 IMPLANT
TRAY FOLEY MTR SLVR 16FR STAT (SET/KITS/TRAYS/PACK) ×2 IMPLANT
TUBING CONNECTING 10 (TUBING) ×2 IMPLANT
TUBING STONE CATCHER TRILOGY (MISCELLANEOUS) ×2 IMPLANT
TUBING UROLOGY SET (TUBING) ×2 IMPLANT
WATER STERILE IRR 1000ML POUR (IV SOLUTION) IMPLANT

## 2019-01-31 NOTE — Anesthesia Postprocedure Evaluation (Signed)
Anesthesia Post Note  Patient: Adam Palmer  Procedure(s) Performed: NEPHROLITHOTOMY PERCUTANEOUS/ INTERVENTIONAL RADIOLOGY TO PLACE NEPHROSTOMY TUBE PRIOR (Left )     Patient location during evaluation: PACU Anesthesia Type: General Level of consciousness: awake and alert Pain management: pain level controlled Vital Signs Assessment: post-procedure vital signs reviewed and stable Respiratory status: spontaneous breathing, nonlabored ventilation, respiratory function stable and patient connected to nasal cannula oxygen Cardiovascular status: blood pressure returned to baseline and stable Postop Assessment: no apparent nausea or vomiting Anesthetic complications: no    Last Vitals:  Vitals:   01/31/19 1330 01/31/19 1400  BP: 108/81 (!) 134/94  Pulse: 83 81  Resp: 18 (!) 21  Temp:    SpO2: 100% 100%    Last Pain:  Vitals:   01/31/19 1400  TempSrc:   PainSc: 5                  Ilan Kahrs S

## 2019-01-31 NOTE — Anesthesia Preprocedure Evaluation (Signed)
Anesthesia Evaluation  Patient identified by MRN, date of birth, ID band Patient awake    Reviewed: Allergy & Precautions, NPO status , Patient's Chart, lab work & pertinent test results  History of Anesthesia Complications Negative for: history of anesthetic complications  Airway Mallampati: III  TM Distance: >3 FB Neck ROM: Full  Mouth opening: Limited Mouth Opening  Dental  (+) Teeth Intact   Pulmonary neg pulmonary ROS, former smoker,    Pulmonary exam normal        Cardiovascular negative cardio ROS Normal cardiovascular exam     Neuro/Psych PSYCHIATRIC DISORDERS Anxiety Depression negative neurological ROS     GI/Hepatic negative GI ROS, Neg liver ROS,   Endo/Other  negative endocrine ROS  Renal/GU Renal disease (nephrolithiasis w/ UPJ obstruction)  negative genitourinary   Musculoskeletal negative musculoskeletal ROS (+)   Abdominal   Peds  Hematology negative hematology ROS (+)   Anesthesia Other Findings   Reproductive/Obstetrics                             Anesthesia Physical Anesthesia Plan  ASA: II  Anesthesia Plan: General   Post-op Pain Management:    Induction: Intravenous  PONV Risk Score and Plan: 3 and Ondansetron, Dexamethasone, Treatment may vary due to age or medical condition and Midazolam  Airway Management Planned: Oral ETT  Additional Equipment: None  Intra-op Plan:   Post-operative Plan: Extubation in OR  Informed Consent: I have reviewed the patients History and Physical, chart, labs and discussed the procedure including the risks, benefits and alternatives for the proposed anesthesia with the patient or authorized representative who has indicated his/her understanding and acceptance.     Dental advisory given  Plan Discussed with:   Anesthesia Plan Comments:         Anesthesia Quick Evaluation

## 2019-01-31 NOTE — OR Nursing (Signed)
Stone taken by Dr. Borden 

## 2019-01-31 NOTE — Op Note (Signed)
UROLOGY OPERATIVE NOTE:  PATIENT:  Adam Palmer  PRE-OPERATIVE DIAGNOSIS:  Left Renal Stone  POST-OPERATIVE DIAGNOSIS: Same  PROCEDURE:  1. Percutaneous nephrostomy sheath placement. 2. Left percutaneous nephrolithotomy (>2 cm.) 3. Antegrade nephrostomy tube placement  SURGEON:  Crecencio Mc, MD  RESIDENT: Federico Flake MD  INDICATION: 29 y.o. male with Left renal calculus. After further discussion, he does wish to proceed with left percutaneous nephrolithotomy for definitive management nor to recover his quickly as he can and not require additional treatment or require a stent. We have reviewed the potential risks and complications of this procedure including but not limited to injury or damage to adjacent structures such as the colon, lung, spleen, etc. We have discussed the risks of bleeding or injury damage to the kidney, the risks of infection, the potential need for additional procedures. We also discussed the expected recovery process. He gives informed consent to proceed.  ANESTHESIA:  General  EBL:  Minimal  DRAINS:   LOCAL MEDICATIONS USED:  None  SPECIMEN:    Description of procedure: After informed consent the patient was taken to the operating room and administered general endotracheal anesthesia. Once fully anesthetized the patient had an 36 French Foley catheter placed It was then moved from the stretcher onto the operating room table in a prone position with bony prominences padded and chest pads in place. The flank with exiting nephrostomy catheter was then sterilely prepped and draped in standard fashion. An official timeout was then performed.  Using the existing nephrostomy catheter access I passed a 0.038 inch floppy tipped guidewire down the ureter into the bladder under fluoroscopy.  This was left in place and the nephrostomy catheter was removed.  A transverse incision was made over the guidewire and a dual lumen catheter was then passed over the  guidewire and down the ureter under fluoroscopy. A second guidewire was passed through this catheter and down the ureter into the bladder under fluoroscopy.  The dual lumen was then removed and one of the guidewires was secured to the drape as a safety guidewire and the second guidewire was used as a working guidewire.  The NephroMax nephrostomy dilating balloon was then passed over the working guidewire into the area of the renal pelvis under fluoroscopy.  It was then inflated using dilute contrast under fluoroscopy until the balloon was fully inflated.  I then passed the 28 French nephrostomy access sheath over the balloon into the area of the renal pelvis under fluoroscopy and then deflated the balloon and removed the dilating balloon.  The 92 French rigid nephroscope was then passed under direct vision through the nephrostomy access sheath.  Clotted blood was evacuated and the stone was identified. Using Trilogy lithotrite the entirey of the stone brden was treated. No fragments were seen with rigid nephroscope, and we then used the flexible nephroscope to clear the remainder of the calices under fluoroscopic guidance. There were no other stone fragments.  We then proceeded with tube placement. A 49fr council catheter was placed through the sheath into the renal pelvis. Sheath was removed. Position of the nephrostomy tube was confirmed with antegrade nephrostogram and the tube was secured with 0-silk suture.  A Kumpe catheter was then advance over the second wire into the mid ureter and secured separately to the skin.  Nephrostomy was irrigated. The tubes were placed to drainage in an ostomy appliance. The patient was awoken from anesthesia and taken to the recovery area.   PLAN OF CARE: Discharge to home after  an overnight stay.  PATIENT DISPOSITION:  PACU - hemodynamically stable.

## 2019-01-31 NOTE — Anesthesia Procedure Notes (Signed)
Procedure Name: Intubation Date/Time: 01/31/2019 11:49 AM Performed by: Gerald Leitz, CRNA Pre-anesthesia Checklist: Patient identified, Patient being monitored, Timeout performed, Emergency Drugs available and Suction available Patient Re-evaluated:Patient Re-evaluated prior to induction Oxygen Delivery Method: Circle system utilized Preoxygenation: Pre-oxygenation with 100% oxygen Induction Type: IV induction Ventilation: Mask ventilation without difficulty Laryngoscope Size: Mac and 3 Grade View: Grade I Tube type: Oral Tube size: 7.5 mm Number of attempts: 1 Airway Equipment and Method: Stylet Placement Confirmation: ETT inserted through vocal cords under direct vision,  positive ETCO2 and breath sounds checked- equal and bilateral Secured at: 23 cm Tube secured with: Tape Dental Injury: Teeth and Oropharynx as per pre-operative assessment

## 2019-01-31 NOTE — Interval H&P Note (Signed)
History and Physical Interval Note:  01/31/2019 11:13 AM  Adam Palmer  has presented today for surgery, with the diagnosis of LEFT RENAL  AND URETEROPELVIC JUNCTION CALCULUS.  The various methods of treatment have been discussed with the patient and family. After consideration of risks, benefits and other options for treatment, the patient has consented to  Procedure(s): NEPHROLITHOTOMY PERCUTANEOUS/ INTERVENTIONAL RADIOLOGY TO PLACE NEPHROSTOMY TUBE PRIOR (Left) as a surgical intervention.  The patient's history has been reviewed, patient examined, no change in status, stable for surgery.  I have reviewed the patient's chart and labs.  Questions were answered to the patient's satisfaction.     [employer]

## 2019-01-31 NOTE — Transfer of Care (Signed)
Immediate Anesthesia Transfer of Care Note  Patient: Adam Palmer  Procedure(s) Performed: Procedure(s): NEPHROLITHOTOMY PERCUTANEOUS/ INTERVENTIONAL RADIOLOGY TO PLACE NEPHROSTOMY TUBE PRIOR (Left)  Patient Location: PACU  Anesthesia Type:General  Level of Consciousness: Alert, Awake, Oriented  Airway & Oxygen Therapy: Patient Spontanous Breathing  Post-op Assessment: Report given to RN  Post vital signs: Reviewed and stable  Last Vitals:  Vitals:   01/31/19 0822  BP: 126/86  Pulse: 82  Resp: 18  Temp: 36.7 C  SpO2: 99%    Complications: No apparent anesthesia complications

## 2019-01-31 NOTE — Procedures (Signed)
Interventional Radiology Procedure Note  Procedure: LEFT percutaneous nephrostomy access with ureteral catheter placement for PCNL access.   Complications: None  Estimated Blood Loss: None  Recommendations: - To OR for PCNL as planned.   Signed,  Sterling Big, MD

## 2019-01-31 NOTE — H&P (Signed)
Chief Complaint: Patient was seen in consultation today for left ureteral stent placement.  Referring Physician(s): Borden,Lester  Supervising Physician: Jacqulynn Cadet  Patient Status: [facility] - Out-pt  History of Present Illness: Adam Palmer is a 29 y.o. male with a past medical history significant for anxiety, depression and recurrent urolithiasis who presents today for left ureteral stent placement prior to percutaneous lithotripsy with Dr. Alinda Money. Adam Palmer has been followed by urology since 2019 for recurrent urolithiasis and gross hematuria - at that time a 9 mm nonobstructing stone was found and patient was planned for possible intervention however he was briefly lost to follow up until the end of 2019 when he developed left flank pain and nausea. He was seen by his PCP and a KUB was performed which noted a 1.9 x 1.8 cm calcification in the left renal pelvis and he was referred back to urology. He met with Dr. Alinda Money on 01/28/19 and after thorough discussion of available treatment options he has decided to proceed with percutaneous lithotripsy and IR has been asked to place a left ureteral stent to provide access for this procedure.   Adam Palmer reports he is very nervous about his procedures today, he is asking if there will be a lot of pain and if he will be put to sleep. He reports ongoing left sided flank pain and intermittent nausea without vomiting. He denies hematuria. He denies any other complaints. He states understanding of the requested procedure and wishes to proceed.   Past Medical History:  Diagnosis Date  . Anxiety   . Depression   . History of kidney stones   . Kidney calculi     Past Surgical History:  Procedure Laterality Date  . APPENDECTOMY      Allergies: Patient has no known allergies.  Medications: Prior to Admission medications   Medication Sig Start Date End Date Taking? Authorizing Provider  ALPRAZolam (XANAX) 0.25 MG tablet Take 1  tablet (0.25 mg total) by mouth 2 (two) times daily as needed for anxiety. To last 1 year. 11/24/18   Hubbard Hartshorn, FNP  Armodafinil 50 MG tablet Take 1 tablet (50 mg total) by mouth daily. Patient taking differently: Take 50 mg by mouth 2 (two) times a week.  11/24/18   Hubbard Hartshorn, FNP  escitalopram (LEXAPRO) 10 MG tablet TAKE 1/2 TABLET ONCE DAILY FOR 7 DAYS, THEN INCREASE TO 1 TABLET ONCE DAILY. Patient taking differently: Take 10 mg by mouth daily.  01/11/19   Hubbard Hartshorn, FNP  Icosapent Ethyl 1 g CAPS Take 2 capsules (2 g total) by mouth 2 (two) times daily. 11/25/18   Hubbard Hartshorn, FNP  levocetirizine (XYZAL) 5 MG tablet Take 1 tablet (5 mg total) by mouth every evening. Patient taking differently: Take 5 mg by mouth daily as needed for allergies.  04/30/18   Hubbard Hartshorn, FNP  oxyCODONE-acetaminophen (PERCOCET/ROXICET) 5-325 MG tablet Take 2 tablets by mouth every 6 (six) hours as needed for severe pain.  01/19/19   [provider]  rosuvastatin (CRESTOR) 20 MG tablet Take 1 tablet (20 mg total) by mouth daily. 11/25/18   Hubbard Hartshorn, FNP  tamsulosin (FLOMAX) 0.4 MG CAPS capsule TAKE 1 CAPSULE BY MOUTH EVERY DAY Patient not taking: Reported on 01/24/2019 08/26/18   Hubbard Hartshorn, FNP  traMADol (ULTRAM) 50 MG tablet Take 1 tablet (50 mg total) by mouth every 6 (six) hours as needed for moderate pain or severe pain. 01/18/19  Hubbard Hartshorn, FNP     Family History  Problem Relation Age of Onset  . Anxiety disorder Mother   . Anxiety disorder Father   . Kidney disease Neg Hx   . Prostate cancer Neg Hx   . Bladder Cancer Neg Hx   . Kidney cancer Neg Hx     Social History   Socioeconomic History  . Marital status: Married    Spouse name: Myriam Jacobson  . Number of children: 3  . Years of education: Not on file  . Highest education level: Not on file  Occupational History  . Not on file  Tobacco Use  . Smoking status: Former Smoker    Packs/day: 0.50    Years:  4.00    Pack years: 2.00  . Smokeless tobacco: Current User    Types: Chew  . Tobacco comment: quit 9 years  Substance and Sexual Activity  . Alcohol use: Yes    Comment: rare  . Drug use: No  . Sexual activity: Yes    Partners: Female  Other Topics Concern  . Not on file  Social History Narrative   Married, Wife is Myriam Jacobson, has a 50 (son), 44 (son), and 92yo (daughter)   Social Determinants of Health   Financial Resource Strain:   . Difficulty of Paying Living Expenses: Not on file  Food Insecurity:   . Worried About Charity fundraiser in the Last Year: Not on file  . Ran Out of Food in the Last Year: Not on file  Transportation Needs:   . Lack of Transportation (Medical): Not on file  . Lack of Transportation (Non-Medical): Not on file  Physical Activity:   . Days of Exercise per Week: Not on file  . Minutes of Exercise per Session: Not on file  Stress:   . Feeling of Stress : Not on file  Social Connections:   . Frequency of Communication with Friends and Family: Not on file  . Frequency of Social Gatherings with Friends and Family: Not on file  . Attends Religious Services: Not on file  . Active Member of Clubs or Organizations: Not on file  . Attends Archivist Meetings: Not on file  . Marital Status: Not on file     Review of Systems: A 12 point ROS discussed and pertinent positives are indicated in the HPI above.  All other systems are negative.  Review of Systems  Constitutional: Negative for chills and fever.  Respiratory: Negative for cough and shortness of breath.   Cardiovascular: Negative for chest pain.  Gastrointestinal: Positive for nausea. Negative for abdominal pain, diarrhea and vomiting.  Genitourinary: Positive for flank pain. Negative for dysuria and hematuria.  Skin: Negative for rash.  Neurological: Negative for dizziness and headaches.  Psychiatric/Behavioral: The patient is nervous/anxious.     Vital Signs: There were no vitals  taken for this visit.  Physical Exam Vitals reviewed.  Constitutional:      General: He is not in acute distress. HENT:     Head: Normocephalic.     Mouth/Throat:     Mouth: Mucous membranes are moist.     Pharynx: Oropharynx is clear. No oropharyngeal exudate or posterior oropharyngeal erythema.  Cardiovascular:     Rate and Rhythm: Regular rhythm. Tachycardia present.  Pulmonary:     Effort: Pulmonary effort is normal.     Breath sounds: Normal breath sounds.  Abdominal:     General: There is no distension.     Palpations: Abdomen  is soft.     Tenderness: There is no abdominal tenderness.  Skin:    General: Skin is warm and dry.  Neurological:     Mental Status: He is alert and oriented to person, place, and time.  Psychiatric:        Mood and Affect: Mood normal.        Behavior: Behavior normal.        Thought Content: Thought content normal.        Judgment: Judgment normal.      MD Evaluation Airway: WNL Heart: WNL Abdomen: WNL Chest/ Lungs: WNL ASA  Classification: 2 Mallampati/Airway Score: One   Imaging: DG Abd 1 View  Result Date: 01/18/2019 CLINICAL DATA:  Hematuria EXAM: ABDOMEN - 1 VIEW COMPARISON:  May 18, 2017 FINDINGS: There is a focal calculus at the level of the left renal pelvis measuring 1.9 x 1.8 cm. There is a calcification in the lower left pelvis which is stable and likely represents a phlebolith. There is moderate stool in the colon. There is no bowel dilatation or air-fluid level to suggest bowel obstruction. No free air. IMPRESSION: 1.9 x 1.8 cm calculus at the level of the left renal pelvis. Apparent small phlebolith in the lower left pelvis. Moderate stool in colon. No bowel obstruction or free air evident. These results will be called to the ordering clinician or representative by the Radiologist Assistant, and communication documented in the PACS or zVision Dashboard. Electronically Signed   By: Lowella Grip III M.D.   On: 01/18/2019  08:11    Labs:  CBC: Recent Labs    11/24/18 0000 01/27/19 1000  WBC 9.8 8.9  HGB 15.7 14.9  HCT 47.1 47.1  PLT 301 261    COAGS: No results for input(s): INR, APTT in the last 8760 hours.  BMP: Recent Labs    11/24/18 0000 01/27/19 1000  NA 138 139  K 4.4 3.6  CL 101 104  CO2 28 27  GLUCOSE 90 103*  BUN 10 15  CALCIUM 10.4* 9.4  CREATININE 0.85 0.87  GFRNONAA 117 >60  GFRAA 136 >60    LIVER FUNCTION TESTS: Recent Labs    11/24/18 0000  BILITOT 0.6  AST 23  ALT 28  PROT 8.3*    TUMOR MARKERS: No results for input(s): AFPTM, CEA, CA199, CHROMGRNA in the last 8760 hours.  Assessment and Plan:  29 y/o M with history of recurrent urolithiasis followed by Dr. Alinda Money who presents today for left ureteral stent placement prior to percutaneous lithotripsy.  Patient has been NPO since midnight, he does not take blood thinning medications. Afebrile, pre-procedure labs pending at the time of this note writing and will be reviewed prior to procedure.  Risks and benefits of left ureteral stent placement was discussed with the patient including, but not limited to, infection, bleeding, significant bleeding causing loss or decrease in renal function or damage to adjacent structures.   All of the patient's questions were answered, patient is agreeable to proceed.  Consent signed and in chart.  Thank you for this interesting consult.  I greatly enjoyed meeting CADON RACZKA and look forward to participating in their care.  A copy of this report was sent to the requesting provider on this date.  Electronically Signed: Joaquim Nam, PA-C 01/31/2019, 8:12 AM   I spent a total of 30 Minutes in face to face in clinical consultation, greater than 50% of which was counseling/coordinating care for left ureteral stent placement.

## 2019-02-01 DIAGNOSIS — N200 Calculus of kidney: Secondary | ICD-10-CM | POA: Diagnosis not present

## 2019-02-01 LAB — BASIC METABOLIC PANEL
Anion gap: 10 (ref 5–15)
BUN: 12 mg/dL (ref 6–20)
CO2: 23 mmol/L (ref 22–32)
Calcium: 9.2 mg/dL (ref 8.9–10.3)
Chloride: 105 mmol/L (ref 98–111)
Creatinine, Ser: 0.89 mg/dL (ref 0.61–1.24)
GFR calc Af Amer: >60 mL/min (ref >60)
GFR calc non Af Amer: >60 mL/min (ref >60)
Glucose, Bld: 131 mg/dL — ABNORMAL HIGH (ref 70–99)
Potassium: 4.1 mmol/L (ref 3.5–5.1)
Sodium: 138 mmol/L (ref 135–145)

## 2019-02-01 LAB — HEMOGLOBIN AND HEMATOCRIT, BLOOD
HCT: 41.4 % (ref 39.0–52.0)
Hemoglobin: 13.1 g/dL (ref 13.0–17.0)

## 2019-02-01 MED ORDER — OXYCODONE-ACETAMINOPHEN 5-325 MG PO TABS: 1.0000 | ORAL_TABLET | ORAL | 0 refills | Status: DC | PRN

## 2019-02-01 MED ORDER — CHLORHEXIDINE GLUCONATE CLOTH 2 % EX PADS
6.0000 | MEDICATED_PAD | Freq: Every day | CUTANEOUS | Status: DC
  Administered 2019-02-01: 6 via TOPICAL

## 2019-02-01 NOTE — Discharge Summary (Signed)
[facility] Discharge Summary  Admit date: 01/31/2019  Discharge date and time: 02/01/19   Discharge to: Home  Discharge Service: Urology  Discharge Attending Physician:  Alinda Money  Discharge  Diagnoses: Kidney Stone   OR Procedures: Procedure(s): NEPHROLITHOTOMY PERCUTANEOUS/ INTERVENTIONAL RADIOLOGY TO PLACE NEPHROSTOMY TUBE PRIOR 01/31/2019   Ancillary Procedures: None   Discharge Day Services: The patient was seen and examined by the Urology team both in the morning and immediately prior to discharge.  Vital signs and laboratory values were stable and within normal limits.  Nephrostomy tube and Kumpe catheter removed.    Subjective  No acute events overnight. Pain Controlled, ambulated, tolerated p.o.  Objective Patient Vitals for the past 8 hrs:  BP Temp src Pulse Resp SpO2 Height Weight  02/01/19 0754 - - - - - 5\' 10"  (1.778 m) 91 kg  02/01/19 0503 131/67 Oral 87 20 97 % - -   No intake/output data recorded.  General Appearance:        No acute distress Lungs:                       Normal work of breathing on room air Heart:                                Regular rate and rhythm Abdomen:                         Soft, non-tender, non-distended GU:        No major bleeding from nephrostomy tube site after tube removal and Kumpe removal.  Foley removed and voiding spontaneously Extremities:                      Warm and well perfused   Hospital Course:  The patient underwent left percutaneous nephrostolithotomy with prior access (IR)  on 01/31/2019.  The patient tolerated the procedure well, was extubated in the OR, and afterwards was taken to the PACU for routine post-surgical care. When stable the patient was transferred to the floor.     The patient did well postoperatively.  Pain was controlled overnight and nephrostomy tube was removed the morning of postoperative day 1.  No major bleeding from incision site.  Kumpe catheter was then removed.  Catheter was also  removed patient passed a trial of void.  Sample sent for analysis.  By the afternoon of postoperative day 1 patient had his pain controlled, ambulating, tolerating p.o., voiding spontaneously.  Will follow up with [place] on 02/22/2019 which is already scheduled     Condition at Discharge: Improved  Discharge Medications:  Allergies as of 02/01/2019   No Known Allergies     Medication List    STOP taking these medications   tamsulosin 0.4 MG Caps capsule Commonly known as: FLOMAX   traMADol 50 MG tablet Commonly known as: ULTRAM     TAKE these medications   ALPRAZolam 0.25 MG tablet Commonly known as: XANAX Take 1 tablet (0.25 mg total) by mouth 2 (two) times daily as needed for anxiety. To last 1 year.   Armodafinil 50 MG tablet Take 1 tablet (50 mg total) by mouth daily. What changed: when to take this   escitalopram 10 MG tablet Commonly known as: LEXAPRO TAKE 1/2 TABLET ONCE DAILY FOR 7 DAYS, THEN INCREASE TO 1 TABLET ONCE DAILY. What changed: See the new instructions.  icosapent Ethyl 1 g capsule Commonly known as: VASCEPA Take 2 capsules (2 g total) by mouth 2 (two) times daily.   levocetirizine 5 MG tablet Commonly known as: Xyzal Take 1 tablet (5 mg total) by mouth every evening. What changed:   when to take this  reasons to take this   oxyCODONE-acetaminophen 5-325 MG tablet Commonly known as: Percocet Take 1 tablet by mouth every 4 (four) hours as needed for severe pain. What changed:   how much to take  when to take this   rosuvastatin 20 MG tablet Commonly known as: Crestor Take 1 tablet (20 mg total) by mouth daily.

## 2019-02-01 NOTE — Progress Notes (Signed)
Avs given to patient and all questions answered. Educated on how to open and close pouch and empty as well. Wife demonstrated at bedside with no additional questions.

## 2019-02-01 NOTE — Discharge Instructions (Signed)
Percutaneous Nephrolithotomy, Care After This sheet gives you information about how to care for yourself after your procedure. Your health care provider may also give you more specific instructions. If you have problems or questions, contact your health care provider. What can I expect after the procedure? After the procedure, it is common to have:  Soreness or pain.  A small amount of blood or clear fluid coming from your incision for a few days.  Fatigue.  Some blood in your urine. This will last for a few days. Follow these instructions at home: Medicines  Take over-the-counter and prescription medicines only as told by your health care provider.  If you were prescribed an antibiotic medicine, take it as told by your health care provider. Do not stop using the antibiotic even if you start to feel better.  Ask your health care provider if the medicine prescribed to you: ? Requires you to avoid driving or using heavy machinery. ? Can cause constipation. You may need to take these actions to prevent or treat constipation:  Drink enough fluid to keep your urine pale yellow.  Take over-the-counter or prescription medicines.  Eat foods that are high in fiber, such as beans, whole grains, and fresh fruits and vegetables.  Limit foods that are high in fat and processed sugars, such as fried or sweet foods. Incision care  ? Leave the bag on your back until drainage slows down. Then simply peel off the bag like a sticker. The wound will close on its own  Check your incision area every day for signs of infection. Check for: ? Redness, swelling, or pain. ? More fluid or blood. ? Warmth. ? Pus or a bad smell.  Do not take baths, swim, or use a hot tub until your health care provider approves. Ask your health care provider if you may take showers. You may only be allowed to take sponge baths. Activity  Avoid strenuous activities for as long as told by your health care  provider.  Return to your normal activities as told by your health care provider. Ask your health care provider what activities are safe for you. General instructions  Do not use any products that contain nicotine or tobacco, such as cigarettes, e-cigarettes, and chewing tobacco. These can delay incision healing after surgery. If you need help quitting, ask your health care provider.  Keep all follow-up visits as told by your health care provider. This is important. ? If you still have a stent, your health care provider will need to remove it after 1-2 weeks. Contact a health care provider if:  You have a fever.  You have redness, swelling, or pain around your incision.  You have more fluid or blood coming from your incision.  Your incision feels warm to the touch.  You have pus or a bad smell coming from your incision.  You lose your appetite.  You feel nauseous or you vomit.  You have been sent home with a urinary catheter or a surgical drain and urine flow suddenly stops, followed by kidney pain. Get help right away if:  You notice a large amount of blood in your urine.  You have blood clots in your urine.  You cannot urinate.  You have chest pain or trouble breathing. Summary  After the procedure, it is common to feel soreness and discomfort. You may also see blood in your urine.  You will be told how to care for yourself after the procedure. Follow instructions on how to  care for your incision and how to recognize signs of infection.  Avoid activities that require great effort. Return to activities as told by your health care provider.  Get help right away if you have blood clots in your urine, you cannot urinate, or you have trouble breathing.   1. You may see some blood in the urine and may have some burning with urination for 48-72 hours. You also may notice that you have to urinate more frequently or urgently after your procedure which is normal.  2. You should  call should you develop an inability urinate, fever > 101, persistent nausea and vomiting that prevents you from eating or drinking to stay hydrated.  3. If you have a stent, you will likely urinate more frequently and urgently until the stent is removed and you may experience some discomfort/pain in the lower abdomen and flank especially when urinating. You may take pain medication prescribed to you if needed for pain. You may also intermittently have blood in the urine until the stent is removed. 4. If you have questions, you should call the office [phone]) to notify us.

## 2019-02-01 NOTE — Progress Notes (Signed)
Urology Progress Note   1 Day Post-Op s/p left percutaneous nephrolithotomy  Subjective: Did well overnight.  Labs are in normal limits.  Hemoglobin 13.1 from 15.3. 1.8L from neph and 1.7L from foley  Objective: Vital signs in last 24 hours: Temp:  [97.6 F (36.4 C)-98.5 F (36.9 C)] 98.3 F (36.8 C) (01/05 0111) Pulse Rate:  [70-104] 87 (01/05 0503) Resp:  [13-23] 20 (01/05 0503) BP: (91-141)/(54-94) 131/67 (01/05 0503) SpO2:  [92 %-100 %] 97 % (01/05 0503)  Intake/Output from previous day: 01/04 0701 - 01/05 0700 In: 2025.4 [P.O.:220; I.V.:1655.4; IV Piggyback:150] Out: 3935 [Urine:3925; Blood:10] Intake/Output this shift: No intake/output data recorded.  Physical Exam:  General: Alert and oriented CV: RRR Lungs: Normal work of breathing Abdomen: Soft, appropriately tender.  GU: Left neph with rose colored urine, no clots. Foley light red.   Ext: NT, No erythema  Lab Results: Recent Labs    01/31/19 0910 02/01/19 0435  HGB 15.3 13.1  HCT 46.9 41.4   BMET Recent Labs    01/31/19 0910 02/01/19 0435  NA 139 138  K 4.2 4.1  CL 104 105  CO2 27 23  GLUCOSE 96 131*  BUN 13 12  CREATININE 0.84 0.89  CALCIUM 9.9 9.2     Studies/Results: DG C-Arm 1-60 Min-No Report  Result Date: 01/31/2019 Fluoroscopy was utilized by the requesting physician.  No radiographic interpretation.   IR URETERAL STENT LEFT NEW ACCESS W/O SEP NEPHROSTOMY CATH  Result Date: 01/31/2019 INDICATION: Renal stones, access for left percutaneous nephrolithotomy. EXAM: 1. Percutaneous puncture of the renal collecting system under fluoroscopic guidance 2. Placement of a percutaneous nephrostomy tube Operating Physician:  Sterling Big, MD COMPARISON:  CT of the abdomen and pelvis-04/27/2017 MEDICATIONS: 400 mg Cipro; The antibiotic was administered in an appropriate time frame prior to skin puncture. ANESTHESIA/SEDATION: Fentanyl 100 mcg IV; Versed 4 mg IV Moderate Sedation Time:  38 minutes  The patient was continuously monitored during the procedure by the interventional radiology nurse under my direct supervision. CONTRAST:  25mL OMNIPAQUE IOHEXOL 300 MG/ML SOLN, 35mL OMNIPAQUE IOHEXOL 300 MG/ML SOLN - administered into the collecting system(s) FLUOROSCOPY TIME:  Fluoroscopy Time: 13 minutes 48 seconds (204 mGy). COMPLICATIONS: None immediate. PROCEDURE: Informed written consent was obtained from the patient after a thorough discussion of the procedural risks, benefits and alternatives. All questions were addressed. Maximal Sterile Barrier Technique was utilized including caps, mask, sterile gowns, sterile gloves, sterile drape, hand hygiene and skin antiseptic. A timeout was performed prior to the initiation of the procedure. A pre procedural spot fluoroscopic image was obtained of the upper abdomen. Ultrasound scanning performed of the kidney was negative for significant hydronephrosis. As such, the stone within the renal pelvis was targeted fluoroscopically with a 22 gauge Chiba needle. Access to the collecting system was confirmed with advancement of a Nitrex wire into the collecting system. The needle was exchanged for the inner 3 French catheter from an Accustick set and contrast injection confirmed access. A small amount of air was injected into the collecting system to help delineate a posterior calyx. A posterior interpolar calyx was targeted with a 22 gauge Chiba needle. Access to the calyx was confirmed with advancement of a Nitrex wire into the collecting system. An Accustick set was utilized to dilate the tract and was subsequently exchanged for a Kumpe catheter over a Bentson wire. The Kumpe catheter was advanced down the ureter and into the urinary bladder. Postprocedural spot radiographs were obtained in various obliquities and  the catheter was sutured to the skin. The catheter was capped and a dressing was placed. The patient tolerated the procedure well without immediate  postprocedural complication. IMPRESSION: Successful fluoroscopic guided left percutaneous nephrostomy with placement of a 5 French Kumpe catheter to the level of the urinary bladder to be utilized during impending nephrolithotomy procedure. Electronically Signed   By: Jacqulynn Cadet M.D.   On: 01/31/2019 14:56    Assessment/Plan:  29 y.o. male s/p Left PCNL.    -Regular diet -Discontinue fluids -Discontinue Foley, trial void -Nephrostomy tube removed this morning, no major bleeding, removed Kumpe catheter -No need for further antibiotics discharge given off easily removed  -pain medication to be sent to pharmacy - Urology followed already scheduled  Dispo: Likely home today    LOS: 0 days   Adam Palmer 02/01/2019, 7:17 AM

## 2019-02-03 ENCOUNTER — Other Ambulatory Visit: Payer: Self-pay

## 2019-02-03 ENCOUNTER — Emergency Department (HOSPITAL_COMMUNITY): Payer: Managed Care, Other (non HMO)

## 2019-02-03 ENCOUNTER — Emergency Department (HOSPITAL_COMMUNITY)
Admission: EM | Admit: 2019-02-03 | Discharge: 2019-02-04 | Disposition: A | Discharge status: Home / Self Care | Payer: Managed Care, Other (non HMO) | Attending: Emergency Medicine | Admitting: Emergency Medicine

## 2019-02-03 DIAGNOSIS — R109 Unspecified abdominal pain: Secondary | ICD-10-CM

## 2019-02-03 DIAGNOSIS — F17220 Nicotine dependence, chewing tobacco, uncomplicated: Secondary | ICD-10-CM | POA: Diagnosis not present

## 2019-02-03 DIAGNOSIS — Z79899 Other long term (current) drug therapy: Secondary | ICD-10-CM | POA: Insufficient documentation

## 2019-02-03 DIAGNOSIS — R1032 Left lower quadrant pain: Secondary | ICD-10-CM | POA: Insufficient documentation

## 2019-02-03 DIAGNOSIS — J45909 Unspecified asthma, uncomplicated: Secondary | ICD-10-CM | POA: Diagnosis not present

## 2019-02-03 LAB — CBC WITH DIFFERENTIAL/PLATELET
Abs Immature Granulocytes: 0.1 10*3/uL — ABNORMAL HIGH (ref 0.00–0.07)
Basophils Absolute: 0.1 10*3/uL (ref 0.0–0.1)
Basophils Relative: 0 %
Eosinophils Absolute: 0.1 10*3/uL (ref 0.0–0.5)
Eosinophils Relative: 1 %
HCT: 46.3 % (ref 39.0–52.0)
Hemoglobin: 14.7 g/dL (ref 13.0–17.0)
Immature Granulocytes: 1 %
Lymphocytes Relative: 15 %
Lymphs Abs: 2.5 10*3/uL (ref 0.7–4.0)
MCH: 28.8 pg (ref 26.0–34.0)
MCHC: 31.7 g/dL (ref 30.0–36.0)
MCV: 90.8 fL (ref 80.0–100.0)
Monocytes Absolute: 1.4 10*3/uL — ABNORMAL HIGH (ref 0.1–1.0)
Monocytes Relative: 9 %
Neutro Abs: 11.9 10*3/uL — ABNORMAL HIGH (ref 1.7–7.7)
Neutrophils Relative %: 74 %
Platelets: 295 10*3/uL (ref 150–400)
RBC: 5.1 MIL/uL (ref 4.22–5.81)
RDW: 14 % (ref 11.5–15.5)
WBC: 16.1 10*3/uL — ABNORMAL HIGH (ref 4.0–10.5)
nRBC: 0 % (ref 0.0–0.2)

## 2019-02-03 LAB — I-STAT CHEM 8, ED
BUN: 16 mg/dL (ref 6–20)
Calcium, Ion: 1.24 mmol/L (ref 1.15–1.40)
Chloride: 99 mmol/L (ref 98–111)
Creatinine, Ser: 1.2 mg/dL (ref 0.61–1.24)
Glucose, Bld: 92 mg/dL (ref 70–99)
HCT: 48 % (ref 39.0–52.0)
Hemoglobin: 16.3 g/dL (ref 13.0–17.0)
Potassium: 4.6 mmol/L (ref 3.5–5.1)
Sodium: 138 mmol/L (ref 135–145)
TCO2: 30 mmol/L (ref 22–32)

## 2019-02-03 LAB — BASIC METABOLIC PANEL
Anion gap: 10 (ref 5–15)
BUN: 15 mg/dL (ref 6–20)
CO2: 29 mmol/L (ref 22–32)
Calcium: 9.9 mg/dL (ref 8.9–10.3)
Chloride: 98 mmol/L (ref 98–111)
Creatinine, Ser: 1.08 mg/dL (ref 0.61–1.24)
GFR calc Af Amer: >60 mL/min (ref >60)
GFR calc non Af Amer: >60 mL/min (ref >60)
Glucose, Bld: 92 mg/dL (ref 70–99)
Potassium: 4.5 mmol/L (ref 3.5–5.1)
Sodium: 137 mmol/L (ref 135–145)

## 2019-02-03 MED ORDER — SODIUM CHLORIDE 0.9 % IV BOLUS
1000.0000 mL | Freq: Once | INTRAVENOUS | Status: AC
  Administered 2019-02-03: 1000 mL via INTRAVENOUS

## 2019-02-03 MED ORDER — ONDANSETRON HCL 4 MG/2ML IJ SOLN
4.0000 mg | Freq: Once | INTRAMUSCULAR | Status: AC
  Administered 2019-02-03: 4 mg via INTRAVENOUS
  Filled 2019-02-03: qty 2

## 2019-02-03 MED ORDER — HYDROMORPHONE HCL 1 MG/ML IJ SOLN
1.0000 mg | Freq: Once | INTRAMUSCULAR | Status: AC
  Administered 2019-02-03: 1 mg via INTRAVENOUS
  Filled 2019-02-03: qty 1

## 2019-02-03 NOTE — ED Triage Notes (Signed)
Arrived POV with c/o no drainage in drainage bag from nephrolithotomy surgery on 01/30/2018. Patient reports doctor was more concerned because he has not urinated since this morning. Patient states only drops of urine come out. Pain 9/10

## 2019-02-03 NOTE — ED Provider Notes (Signed)
[facility]-EMERGENCY DEPT Provider Note   CSN: [id number] Arrival date & time: 02/03/19  2013     History Chief Complaint  Patient presents with  . Post-op Problem    Adam Palmer is a 29 y.o. male.  29 yo M with a chief complaints of flank pain and decreased urine output.  Going on for the past day.  He recently had a procedure because he had a 16 mm kidney stone.  They were able to remove that stone and he had so far been without significant complication.  Denied fevers or chills.  Had a couple episodes of vomiting.  The history is provided by the patient.  Illness Severity:  Moderate Onset quality:  Gradual Duration:  1 day Timing:  Constant Progression:  Worsening Chronicity:  New Associated symptoms: nausea and vomiting   Associated symptoms: no abdominal pain, no chest pain, no congestion, no diarrhea, no fever, no headaches, no myalgias, no rash and no shortness of breath        Past Medical History:  Diagnosis Date  . Anxiety   . Depression   . History of kidney stones   . Kidney calculi     Patient Active Problem List   Diagnosis Date Noted  . Kidney stone 01/31/2019  . Shifting sleep-work schedule 04/30/2018  . Mild persistent asthma without complication 04/30/2018  . Pure hypertriglyceridemia 10/29/2017  . Generalized anxiety disorder 10/29/2017  . Overweight (BMI 25.0-29.9) 10/29/2017  . Fatigue 10/29/2017  . Gross hematuria 04/29/2017  . Nephrolithiasis 04/29/2017    Past Surgical History:  Procedure Laterality Date  . APPENDECTOMY    . IR URETERAL STENT LEFT NEW ACCESS W/O SEP NEPHROSTOMY CATH  01/31/2019  . NEPHROLITHOTOMY Left 01/31/2019   Procedure: NEPHROLITHOTOMY PERCUTANEOUS/ INTERVENTIONAL RADIOLOGY TO PLACE NEPHROSTOMY TUBE PRIOR;  Surgeon: Heloise Purpura, MD;  Location: [facility];  Service: Urology;  Laterality: Left;       Family History  Problem Relation Age of Onset  . Anxiety disorder Mother   . Anxiety  disorder Father   . Kidney disease Neg Hx   . Prostate cancer Neg Hx   . Bladder Cancer Neg Hx   . Kidney cancer Neg Hx     Social History   Tobacco Use  . Smoking status: Former Smoker    Packs/day: 0.50    Years: 4.00    Pack years: 2.00  . Smokeless tobacco: Current User    Types: Chew  . Tobacco comment: quit 9 years  Substance Use Topics  . Alcohol use: Yes    Comment: rare  . Drug use: No    Home Medications Prior to Admission medications   Medication Sig Start Date End Date Taking? Authorizing Provider  ALPRAZolam (XANAX) 0.25 MG tablet Take 1 tablet (0.25 mg total) by mouth 2 (two) times daily as needed for anxiety. To last 1 year. 11/24/18  Yes Doren Custard, FNP  Armodafinil 50 MG tablet Take 1 tablet (50 mg total) by mouth daily. Patient taking differently: Take 50 mg by mouth 2 (two) times daily as needed (energy).  11/24/18  Yes Doren Custard, FNP  escitalopram (LEXAPRO) 10 MG tablet TAKE 1/2 TABLET ONCE DAILY FOR 7 DAYS, THEN INCREASE TO 1 TABLET ONCE DAILY. Patient taking differently: Take 10 mg by mouth daily.  01/11/19  Yes Doren Custard, FNP  Icosapent Ethyl 1 g CAPS Take 2 capsules (2 g total) by mouth 2 (two) times daily. 11/25/18  Yes Maurice Small  E, FNP  levocetirizine (XYZAL) 5 MG tablet Take 1 tablet (5 mg total) by mouth every evening. Patient taking differently: Take 5 mg by mouth daily as needed for allergies.  04/30/18  Yes Hubbard Hartshorn, FNP  oxyCODONE-acetaminophen (PERCOCET) 5-325 MG tablet Take 1 tablet by mouth every 4 (four) hours as needed for severe pain. 02/01/19 02/01/20 Yes Raynelle Bring, MD  rosuvastatin (CRESTOR) 20 MG tablet Take 1 tablet (20 mg total) by mouth daily. 11/25/18  Yes Hubbard Hartshorn, FNP    Allergies    Patient has no known allergies.  Review of Systems   Review of Systems  Constitutional: Negative for chills and fever.  HENT: Negative for congestion and facial swelling.   Eyes: Negative for discharge and visual  disturbance.  Respiratory: Negative for shortness of breath.   Cardiovascular: Negative for chest pain and palpitations.  Gastrointestinal: Positive for nausea and vomiting. Negative for abdominal pain and diarrhea.  Genitourinary: Positive for decreased urine volume and flank pain.  Musculoskeletal: Negative for arthralgias and myalgias.  Skin: Negative for color change and rash.  Neurological: Negative for tremors, syncope and headaches.  Psychiatric/Behavioral: Negative for confusion and dysphoric mood.    Physical Exam Updated Vital Signs BP (!) 100/56   Pulse 88   Temp 98.7 F (37.1 C) (Oral)   Resp (!) 21   SpO2 93%   Physical Exam Vitals and nursing note reviewed.  Constitutional:      Appearance: He is well-developed.  HENT:     Head: Normocephalic and atraumatic.  Eyes:     Pupils: Pupils are equal, round, and reactive to light.  Neck:     Vascular: No JVD.  Cardiovascular:     Rate and Rhythm: Normal rate and regular rhythm.     Heart sounds: No murmur. No friction rub. No gallop.   Pulmonary:     Effort: No respiratory distress.     Breath sounds: No wheezing.  Abdominal:     General: There is no distension.     Tenderness: There is no guarding or rebound.  Musculoskeletal:        General: Normal range of motion.     Cervical back: Normal range of motion and neck supple.     Comments: Nephro lithotomy bag to the left flank with bloody appearing urine.  Skin:    Coloration: Skin is not pale.     Findings: No rash.  Neurological:     Mental Status: He is alert and oriented to person, place, and time.  Psychiatric:        Behavior: Behavior normal.     ED Results / Procedures / Treatments   Labs (all labs ordered are listed, but only abnormal results are displayed) Labs Reviewed  CBC WITH DIFFERENTIAL/PLATELET - Abnormal; Notable for the following components:      Result Value   WBC 16.1 (*)    Neutro Abs 11.9 (*)    Monocytes Absolute 1.4 (*)     Abs Immature Granulocytes 0.10 (*)    All other components within normal limits  URINALYSIS, ROUTINE W REFLEX MICROSCOPIC - Abnormal; Notable for the following components:   APPearance HAZY (*)    Hgb urine dipstick LARGE (*)    Protein, ur 30 (*)    Leukocytes,Ua TRACE (*)    RBC / HPF >50 (*)    All other components within normal limits  BASIC METABOLIC PANEL  I-STAT CHEM 8, ED    EKG None  Radiology  CT Renal Stone Study  Result Date: 02/03/2019 CLINICAL DATA:  29 year old male with flank pain. Concern for kidney stone. Recent nephrolithotomy on 01/30/2018. EXAM: CT ABDOMEN AND PELVIS WITHOUT CONTRAST TECHNIQUE: Multidetector CT imaging of the abdomen and pelvis was performed following the standard protocol without IV contrast. COMPARISON:  CT abdomen pelvis dated 01/19/2019. FINDINGS: Evaluation of this exam is limited in the absence of intravenous contrast. Lower chest: Bibasilar linear and streaky densities most consistent with atelectasis. No intra-abdominal free air or free fluid. Hepatobiliary: No focal liver abnormality is seen. No gallstones, gallbladder wall thickening, or biliary dilatation. Pancreas: Unremarkable. No pancreatic ductal dilatation or surrounding inflammatory changes. Spleen: Normal in size without focal abnormality. Adrenals/Urinary Tract: The adrenal glands are unremarkable. There has been interval removal of the previously seen 16 mm stone in the left renal pelvis. Small amount of air in the left perinephric space sequela of recent stone removal. No drainable fluid collection or abscess. Several punctate nonobstructing stone fragments noted in the left kidney. There is mild left hydronephrosis and mild left hydroureter. There is a punctate stone in the distal left ureter. There is no hydronephrosis or nephrolithiasis on the right. There is parenchymal lobulation and cortical irregularity of the right kidney, likely areas of scarring. The right ureter and urinary  bladder appear unremarkable. Stomach/Bowel: There is moderate stool throughout the colon. There is no bowel obstruction or active inflammation. Appendectomy. Vascular/Lymphatic: The abdominal aorta and IVC are unremarkable. No portal venous gas. There is no adenopathy. Reproductive: The prostate and seminal vesicles are grossly unremarkable. No pelvic mass. Other: None Musculoskeletal: No acute or significant osseous findings. IMPRESSION: 1. Interval removal of the previously seen 16 mm stone in the left renal pelvis. Punctate stone in the distal left ureter as well as several punctate nonobstructing left renal calculi. There is mild left hydronephrosis. 2. Small air adjacent to the left kidney sequela of recent procedure. No drainable fluid collection/abscess. 3. No bowel obstruction or active inflammation. Electronically Signed   By: Elgie Collard M.D.   On: 02/03/2019 22:45    Procedures Procedures (including critical care time)  Medications Ordered in ED Medications  ketorolac (TORADOL) 15 MG/ML injection 15 mg (has no administration in time range)  sodium chloride 0.9 % bolus 1,000 mL (0 mLs Intravenous Stopped 02/03/19 2323)  HYDROmorphone (DILAUDID) injection 1 mg (1 mg Intravenous Given 02/03/19 2204)  ondansetron (ZOFRAN) injection 4 mg (4 mg Intravenous Given 02/03/19 2205)    ED Course  I have reviewed the triage vital signs and the nursing notes.  Pertinent labs & imaging results that were available during my care of the patient were reviewed by me and considered in my medical decision making (see chart for details).    MDM Rules/Calculators/A&P                      29 yo M with a chief complaints of left flank pain and decreased urine output for the past day.  Has recently had a nephrolithotomy procedure to have a 74mm kidney stone removed.  Patient had called his urology office and just to come here.  CT stone study without obvious complication.  Patient is feeling somewhat better  after some IV medications.  I discussed the case with Dr. Ronne Binning.  He felt this was likely due to blood clots.  Recommended checking a UA and treating with antibiotics if infected.  Controlling his pain have him follow-up with the urologist in the office.  Patient's  pain still seems to be significantly improved.  UA is not obviously infected.  Hematuria.  We will have the patient follow-up with his urologist.  12:37 AM:  I have discussed the diagnosis/risks/treatment options with the patient and believe the pt to be eligible for discharge home to follow-up with Urology. We also discussed returning to the ED immediately if new or worsening sx occur. We discussed the sx which are most concerning (e.g., sudden worsening pain, fever, inability to tolerate by mouth) that necessitate immediate return. Medications administered to the patient during their visit and any new prescriptions provided to the patient are listed below.  Medications given during this visit Medications  ketorolac (TORADOL) 15 MG/ML injection 15 mg (has no administration in time range)  sodium chloride 0.9 % bolus 1,000 mL (0 mLs Intravenous Stopped 02/03/19 2323)  HYDROmorphone (DILAUDID) injection 1 mg (1 mg Intravenous Given 02/03/19 2204)  ondansetron (ZOFRAN) injection 4 mg (4 mg Intravenous Given 02/03/19 2205)     The patient appears reasonably screen and/or stabilized for discharge and I doubt any other medical condition or other [facility] requiring further screening, evaluation, or treatment in the ED at this time prior to discharge.   Final Clinical Impression(s) / ED Diagnoses Final diagnoses:  Flank pain    Rx / DC Orders ED Discharge Orders    None       Melene Plan, DO 02/04/19 0037

## 2019-02-04 LAB — URINALYSIS, ROUTINE W REFLEX MICROSCOPIC
Bacteria, UA: NONE SEEN
Bilirubin Urine: NEGATIVE
Glucose, UA: NEGATIVE mg/dL
Ketones, ur: NEGATIVE mg/dL
Nitrite: NEGATIVE
Protein, ur: 30 mg/dL — AB
RBC / HPF: >50 RBC/hpf — ABNORMAL HIGH (ref 0–5)
Specific Gravity, Urine: 1.016 (ref 1.005–1.030)
pH: 6 (ref 5.0–8.0)

## 2019-02-04 MED ORDER — KETOROLAC TROMETHAMINE 15 MG/ML IJ SOLN
15.0000 mg | Freq: Once | INTRAMUSCULAR | Status: AC
  Administered 2019-02-04: 15 mg via INTRAVENOUS
  Filled 2019-02-04: qty 1

## 2019-02-04 NOTE — Discharge Instructions (Signed)
Take 4 over the counter ibuprofen tablets 3 times a day or 2 over-the-counter naproxen tablets twice a day for pain.  Then take the pain medicine if you feel like you need it. Narcotics do not help with the pain, they only make you care about it less.  You can become addicted to this, people may break into your house to steal it.  It will constipate you.  If you drive under the influence of this medicine you can get a DUI.    

## 2019-03-29 ENCOUNTER — Encounter: Payer: Self-pay | Admitting: Family Medicine

## 2019-03-29 ENCOUNTER — Ambulatory Visit (INDEPENDENT_AMBULATORY_CARE_PROVIDER_SITE_OTHER): Payer: Managed Care, Other (non HMO) | Admitting: Family Medicine

## 2019-03-29 ENCOUNTER — Other Ambulatory Visit: Payer: Self-pay

## 2019-03-29 DIAGNOSIS — F411 Generalized anxiety disorder: Secondary | ICD-10-CM

## 2019-03-29 MED ORDER — ALPRAZOLAM 0.25 MG PO TABS: 0.2500 mg | ORAL_TABLET | Freq: Two times a day (BID) | ORAL | 0 refills | Status: DC | PRN

## 2019-03-29 MED ORDER — ESCITALOPRAM OXALATE 20 MG PO TABS: 20.0000 mg | ORAL_TABLET | Freq: Every day | ORAL | 0 refills | Status: DC

## 2019-03-29 NOTE — Progress Notes (Signed)
Name: Adam Palmer   MRN: [id number]    DOB: March 01, 1990   Date:03/29/2019       Progress Note  Subjective  Chief Complaint  Chief Complaint  Patient presents with  . Anxiety    I connected with  Sherwood Gambler on 03/29/19 at  1:20 PM EST by telephone and verified that I am speaking with the correct person using two identifiers.  I discussed the limitations, risks, security and privacy concerns of performing an evaluation and management service by telephone and the availability of in person appointments. Staff also discussed with the patient that there may be a patient responsible charge related to this service. Patient Location: at home  Provider Location: [facility]   HPI  GAD: he has a long history of anxiety , he has tried multiple medications in the past Trintelix did caused headache and nausea , Effexor causes sexual side effects. He has been Lexapro 10 mg since Fall 2020, he has been taking medication daily and no change in his life but over the past week he had a few panic attacks. That is much more than usual.  He states last one was on Sunday while driving. He states panic attacks starts as worrying , feels nervous, he developed shortness of breath, he starts to shake, face gets hot. Episodes lasts about 5-10 minutes and resolves when he closes his eyes. He has Alprazolam it helps calm him down but it causes him to feel sleepy. He only takes at home. He states Buspar does not work. Last rx was given 10/2018. He takes less than 5 a month, he still has 3 left   Patient Active Problem List   Diagnosis Date Noted  . Kidney stone 01/31/2019  . Shifting sleep-work schedule 04/30/2018  . Mild persistent asthma without complication 04/30/2018  . Pure hypertriglyceridemia 10/29/2017  . Generalized anxiety disorder 10/29/2017  . Overweight (BMI 25.0-29.9) 10/29/2017  . Fatigue 10/29/2017  . Gross hematuria 04/29/2017  . Nephrolithiasis 04/29/2017     Past Surgical History:  Procedure Laterality Date  . APPENDECTOMY    . IR URETERAL STENT LEFT NEW ACCESS W/O SEP NEPHROSTOMY CATH  01/31/2019  . NEPHROLITHOTOMY Left 01/31/2019   Procedure: NEPHROLITHOTOMY PERCUTANEOUS/ INTERVENTIONAL RADIOLOGY TO PLACE NEPHROSTOMY TUBE PRIOR;  Surgeon: Heloise Purpura, MD;  Location: [facility];  Service: Urology;  Laterality: Left;    Family History  Problem Relation Age of Onset  . Anxiety disorder Mother   . Anxiety disorder Father   . Kidney disease Neg Hx   . Prostate cancer Neg Hx   . Bladder Cancer Neg Hx   . Kidney cancer Neg Hx    Social History   Tobacco Use  . Smoking status: Former Smoker    Packs/day: 0.50    Years: 4.00    Pack years: 2.00  . Smokeless tobacco: Current User    Types: Chew  . Tobacco comment: quit 9 years  Substance Use Topics  . Alcohol use: Yes    Comment: rare    GAD 7 : Generalized Anxiety Score 03/29/2019 01/18/2019 02/22/2018  Nervous, Anxious, on Edge 1 0 3  Control/stop worrying 0 0 0  Worry too much - different things 0 0 3  Trouble relaxing 1 0 3  Restless 0 0 0  Easily annoyed or irritable 3 0 3  Afraid - awful might happen 0 0 0  Total GAD 7 Score 5 0 12  Anxiety Difficulty Somewhat difficult Not difficult at all  Somewhat difficult     Current Outpatient Medications:  .  ALPRAZolam (XANAX) 0.25 MG tablet, Take 1 tablet (0.25 mg total) by mouth 2 (two) times daily as needed for anxiety. To last 1 year., Disp: 15 tablet, Rfl: 0 .  Armodafinil 50 MG tablet, Take 1 tablet (50 mg total) by mouth daily. (Patient taking differently: Take 50 mg by mouth 2 (two) times daily as needed (energy). ), Disp: 30 tablet, Rfl: 1 .  escitalopram (LEXAPRO) 10 MG tablet, TAKE 1/2 TABLET ONCE DAILY FOR 7 DAYS, THEN INCREASE TO 1 TABLET ONCE DAILY. (Patient taking differently: Take 10 mg by mouth daily. ), Disp: 90 tablet, Rfl: 1 .  Icosapent Ethyl 1 g CAPS, Take 2 capsules (2 g total) by mouth 2 (two) times daily.,  Disp: 360 capsule, Rfl: 2 .  levocetirizine (XYZAL) 5 MG tablet, Take 1 tablet (5 mg total) by mouth every evening. (Patient taking differently: Take 5 mg by mouth daily as needed for allergies. ), Disp: 90 tablet, Rfl: 1 .  oxyCODONE-acetaminophen (PERCOCET) 5-325 MG tablet, Take 1 tablet by mouth every 4 (four) hours as needed for severe pain., Disp: 20 tablet, Rfl: 0 .  rosuvastatin (CRESTOR) 20 MG tablet, Take 1 tablet (20 mg total) by mouth daily., Disp: 90 tablet, Rfl: 3  No Known Allergies  I personally reviewed active problem list, medication list, allergies, family history, social history, health maintenance with the patient/caregiver today.   ROS  Ten systems reviewed and is negative except as mentioned in HPI   Objective  Virtual encounter, vitals not obtained.  There is no height or weight on file to calculate BMI.  Physical Exam  Awake, alert and oriented   PHQ2/9: Depression screen [facility] 2/9 03/29/2019 01/18/2019 12/17/2018 11/24/2018 04/30/2018  Decreased Interest 2 0 1 1 0  Down, Depressed, Hopeless 1 0 1 1 0  PHQ - 2 Score 3 0 2 2 0  Altered sleeping 0 0 3 3 0  Tired, decreased energy 3 0 1 1 0  Change in appetite 0 0 0 0 0  Feeling bad or failure about yourself  0 0 0 0 0  Trouble concentrating 0 0 0 0 0  Moving slowly or fidgety/restless 0 0 0 0 0  Suicidal thoughts 0 0 0 0 0  PHQ-9 Score 6 0 6 6 0  Difficult doing work/chores Not difficult at all Not difficult at all Somewhat difficult Somewhat difficult Not difficult at all   PHQ-2/9 Result is positive.     Fall Risk: Fall Risk  03/29/2019 01/18/2019 12/17/2018 11/24/2018 04/30/2018  Falls in the past year? 0 0 0 0 0  Number falls in past yr: 0 0 0 0 0  Injury with Fall? 0 0 0 0 0  Follow up - Falls evaluation completed Falls evaluation completed Falls evaluation completed Falls evaluation completed     Assessment & Plan  1. Generalized anxiety disorder  He also seems a little depressed, he had  therapy when his father died, discussed going back to therapy  ( he will contact his insurance first) we will increase dose of lexapro and if no improvement call us back sooner, otherwise follow up with Maurice Small   - escitalopram (LEXAPRO) 20 MG tablet; Take 1 tablet (20 mg total) by mouth daily.  Dispense: 90 tablet; Refill: 0 - ALPRAZolam (XANAX) 0.25 MG tablet; Take 1 tablet (0.25 mg total) by mouth 2 (two) times daily as needed for anxiety. To last 1 year.  Dispense: 15 tablet; Refill: 0 I discussed the assessment and treatment plan with the patient. The patient was provided an opportunity to ask questions and all were answered. The patient agreed with the plan and demonstrated an understanding of the instructions.   The patient was advised to call back or seek an in-person evaluation if the symptoms worsen or if the condition fails to improve as anticipated.  I provided 15  minutes of non-face-to-face time during this encounter.  Loistine Chance, MD

## 2019-04-10 IMAGING — CT CT ABD-PEL WO/W CM
2 of 8 series · 14 of 46 positions shown, 16 images · IV contrast (iopamidol)
Comparison: CT scan 09/09/2015

CLINICAL DATA: Hematuria on and off for the past month. Left flank
pain.

EXAM:
CT ABDOMEN AND PELVIS WITHOUT AND WITH CONTRAST
TECHNIQUE: Multidetector CT imaging of the abdomen and pelvis was performed
following the standard protocol before and following the bolus
administration of intravenous contrast.
CONTRAST:  150mL [id number] IOPAMIDOL (IDD3XK-AKK) INJECTION 61%

[Series 5: cor without without pre · coronal · non-contrast · 0.72mm/px · 3 of 143 slices shown]
[im 36/143  soft-tissue]
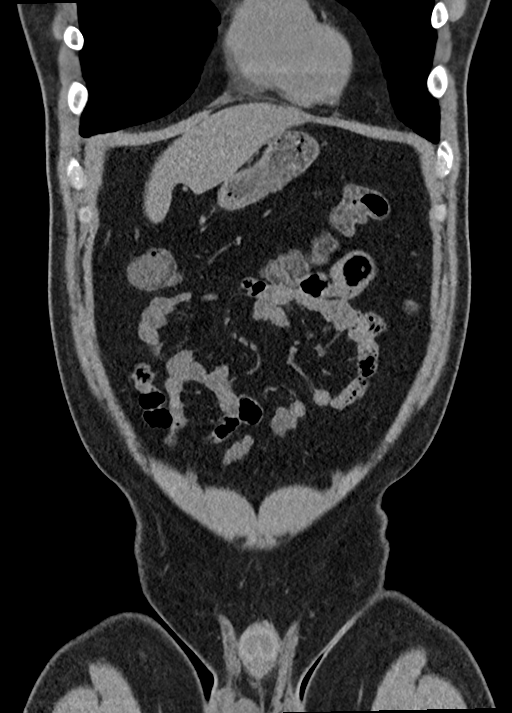
[im 72/143  soft-tissue]
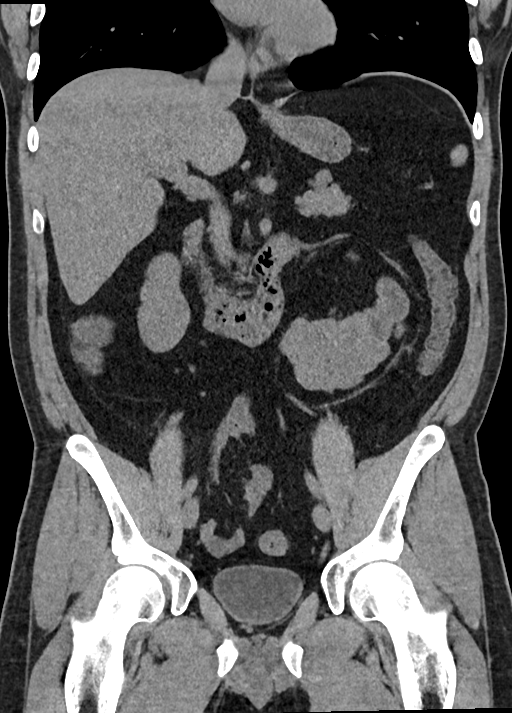
[im 107/143  soft-tissue]
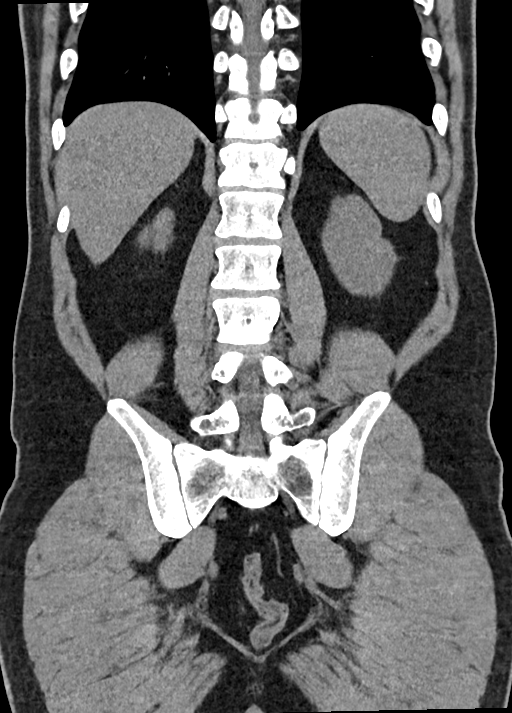

[Series 10: axial delay prone · axial · delayed · 0.69mm/px · z∈[-1475,-1020]mm · 11 of 111 slices shown, 13 images]
[im 10/111  soft-tissue]
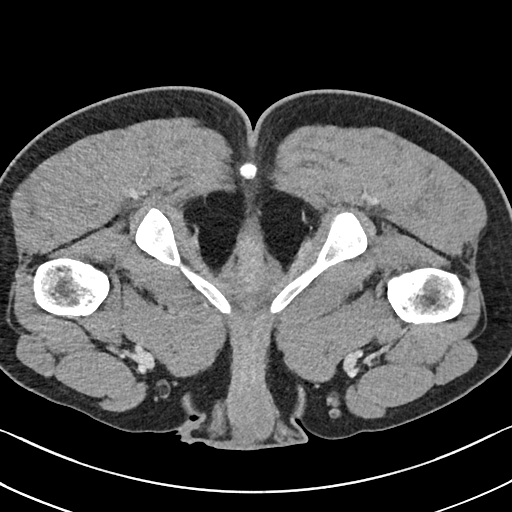
[im 10/111  bone]
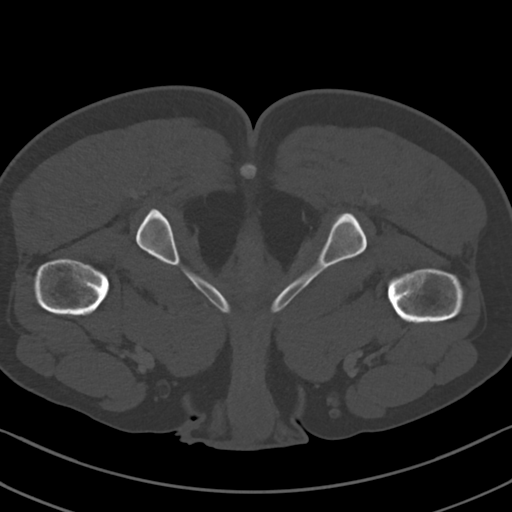
[im 19/111  soft-tissue]
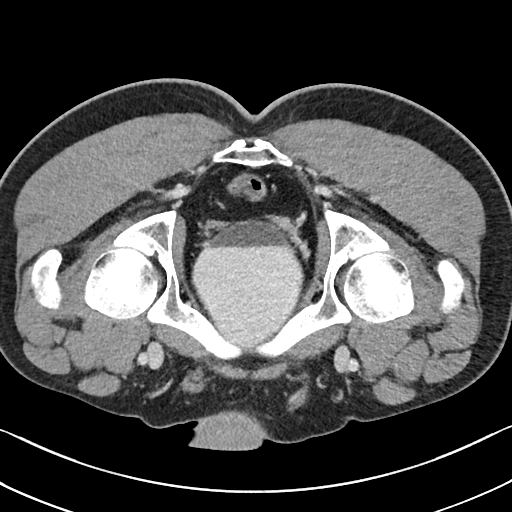
[im 28/111  soft-tissue]
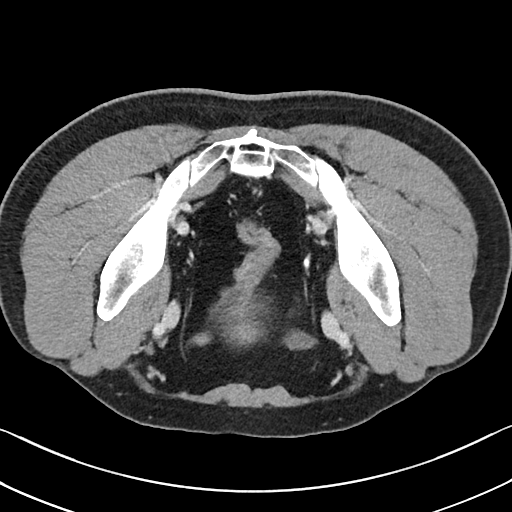
[im 37/111  soft-tissue]
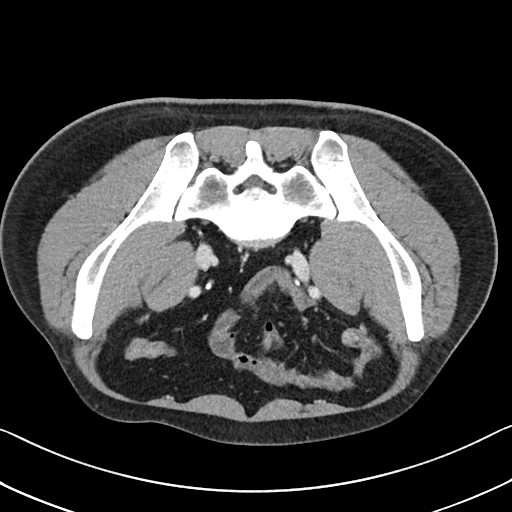
[im 46/111  soft-tissue]
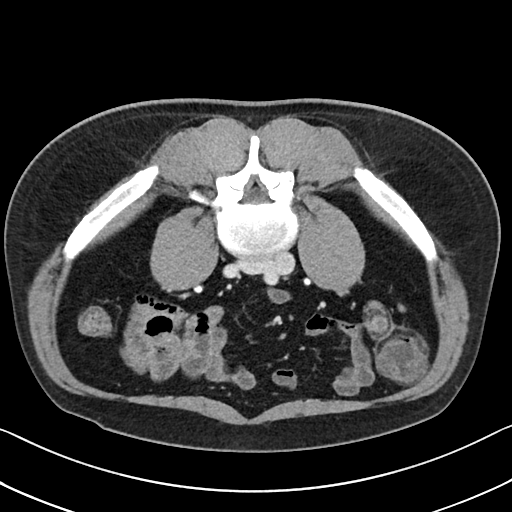
[im 56/111  soft-tissue]
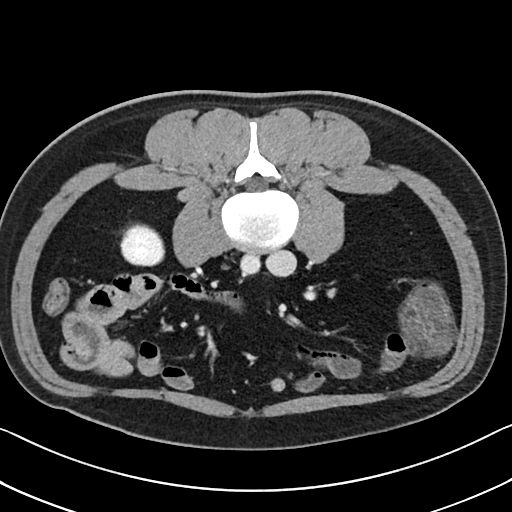
[im 65/111  soft-tissue]
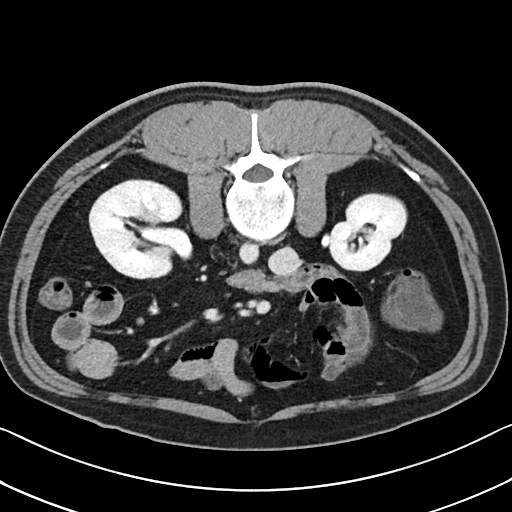
[im 74/111  soft-tissue]
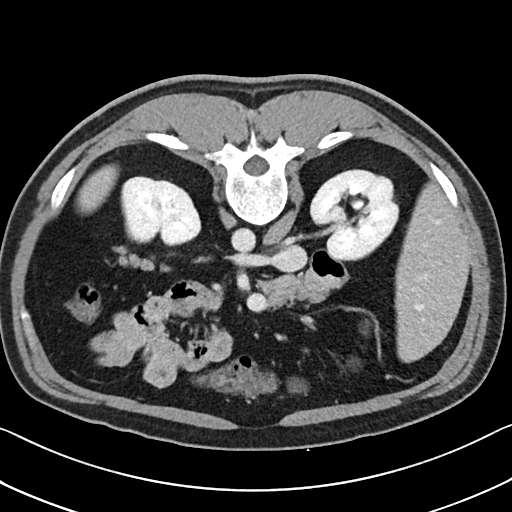
[im 83/111  soft-tissue]
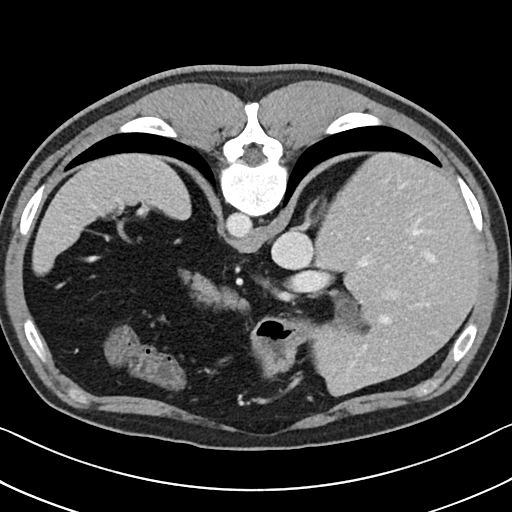
[im 83/111  bone]
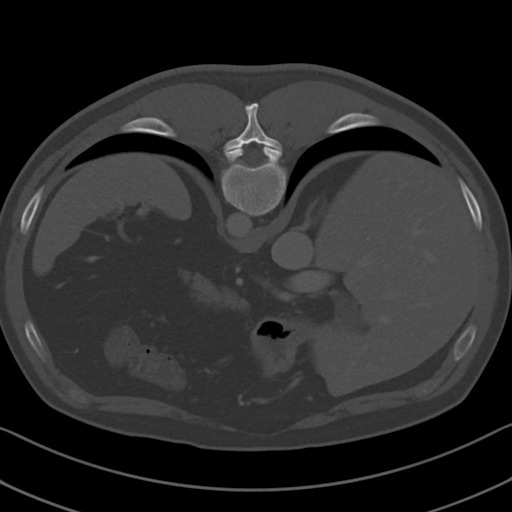
[im 92/111  soft-tissue]
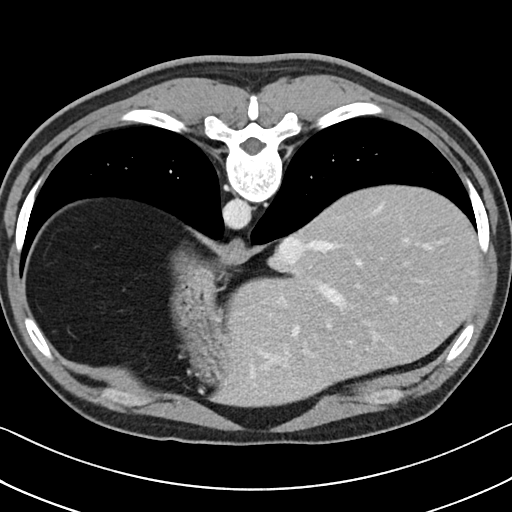
[im 101/111  soft-tissue]
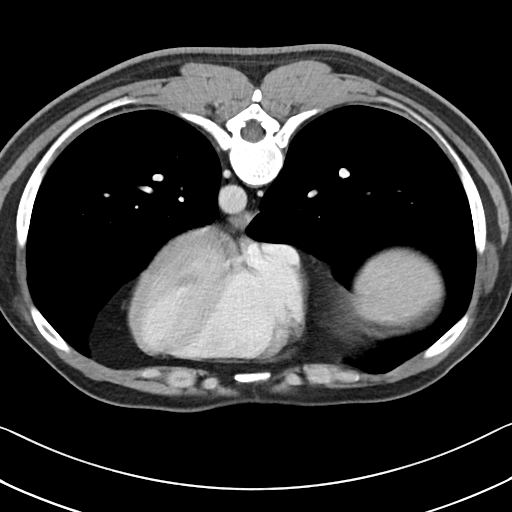

[14 of 46 positions shown; findings below may reference images not displayed]

FINDINGS: Lower chest: The lung bases are clear of acute process. No pleural
effusion or pulmonary lesions. The heart is normal in size. No
pericardial effusion. The distal esophagus and aorta are
unremarkable.

Hepatobiliary: No focal hepatic lesions or intrahepatic biliary
dilatation. The gallbladder is normal. No common bile duct
dilatation.

Pancreas: No mass, inflammation or ductal dilatation.

Spleen: Normal size.  No focal lesions.

Adrenals/Urinary Tract: The adrenal glands are unremarkable and
stable.

Stable 9 mm lower pole left renal calculus. A few other tiny
scattered calculi are noted. No obstructing ureteral calculi or
bladder calculi. Both kidneys demonstrate normal
enhancement/perfusion. No worrisome renal lesions. No significant
collecting system abnormalities. Both ureters are normal. The
bladder is normal.

Stomach/Bowel: The stomach, duodenum, small bowel and colon are
grossly normal without oral contrast. No inflammatory changes, mass
lesions or obstructive findings. The terminal ileum is normal. The
appendix is surgically absent.

Vascular/Lymphatic: The aorta is normal in caliber. No dissection.
The branch vessels are patent. The major venous structures are
patent. No mesenteric or retroperitoneal mass or adenopathy. Small
scattered lymph nodes are noted.

Reproductive: The prostate gland and seminal vesicles are
unremarkable.

Other: No pelvic mass or adenopathy. No free pelvic fluid
collections. No inguinal mass or adenopathy or hernia. No abdominal
wall hernia or subcutaneous lesions.

Musculoskeletal: No significant bony findings.
IMPRESSION: 1. Stable 9 mm lower pole left renal calculus likely accounting for
the patient's hematuria.
2. No obstructing ureteral calculi or bladder calculi. No renal or
bladder mass.
3. No acute abdominal/pelvic findings, mass lesions or
lymphadenopathy.

## 2019-04-25 ENCOUNTER — Ambulatory Visit (INDEPENDENT_AMBULATORY_CARE_PROVIDER_SITE_OTHER): Payer: Managed Care, Other (non HMO) | Admitting: Internal Medicine

## 2019-04-25 ENCOUNTER — Encounter: Payer: Self-pay | Admitting: Internal Medicine

## 2019-04-25 ENCOUNTER — Other Ambulatory Visit: Payer: Self-pay

## 2019-04-25 VITALS — BP 110/72 | HR 92 | Temp 97.7°F | Resp 16 | Ht 70.0 in | Wt 195.1 lb

## 2019-04-25 DIAGNOSIS — R5383 Other fatigue: Secondary | ICD-10-CM | POA: Diagnosis not present

## 2019-04-25 DIAGNOSIS — F411 Generalized anxiety disorder: Secondary | ICD-10-CM

## 2019-04-25 DIAGNOSIS — F331 Major depressive disorder, recurrent, moderate: Secondary | ICD-10-CM

## 2019-04-25 MED ORDER — VENLAFAXINE HCL ER 75 MG PO CP24: 75.0000 mg | ORAL_CAPSULE | Freq: Every day | ORAL | 1 refills | Status: DC

## 2019-04-25 NOTE — Progress Notes (Signed)
Patient ID: Adam Palmer, male    DOB: [birth year], 29 y.o.   MRN: [id number]  PCP: Adam Custard, FNP  Chief Complaint  Patient presents with  . Follow-up  . Anxiety  . Depression    He feels lexapro is making things worse for him/not helping    Subjective:   Adam Palmer is a 29 y.o. male, presents to clinic with CC of the following:  Chief Complaint  Patient presents with  . Follow-up  . Anxiety  . Depression    He feels lexapro is making things worse for him/not helping    HPI:  Patient is a 29 year old male patient of Adam Palmer who had a telephone visit with Dr. Carlynn Palmer on 03/29/2019 for increasing anxiety concerns.  He has a long history of anxiety, diagnosed with GAD.  He has tried multiple medications in the past. Trintelix did cause headache and nausea , Effexor caused sexual side effects. He has been more recently on Lexapro 10 mg since Fall 2020, he has been taking medication daily and the week prior to his last visit on 03/29/19, he noted he had a few panic attacks. That was much more than usual.  He had one while driving.  His panic attacks start as worrying , he feels nervous, often developed shortness of breath, he starts to shake, face gets hot. Episodes last about 5-10 minutes and resolve when he closes his eyes. He has Alprazolam and it helps calm him down but it causes him to feel sleepy. He only takes when at home. He states Buspar does not work. Last rx was given 10/2018. He takes less than 5 a month, and still had 3 remaining at the time of his last visit in early March. Dr. Carlynn Palmer noted concern that he may also be a little depressed when evaluated at the last visit. It was noted that he had therapy when his father died, and going back to therapy was discussed ( he will contact his insurance first). His Lexapro was increased to 20 mg daily at that visit and he was to continue the Xanax as needed.  He follows up today with his wife present,  noting he has been feeling more depressed.  He did not tolerate the increased Lexapro dose at 20 mg, caused GI upset, nausea and vomiting and 2 weeks ago, reduced the dose back to 10 mg.  He notes the 10 mg dose has not been helping at all with his symptoms.  He notes she is sleeping a lot, stays at home, apathetic, sadness, and has had several panic attacks.  They drove to [facility] this past weekend, and had to come back after a couple hours as she could not stop crying, and panicked away from home.  In [employer] this morning, he went to the bathroom, and panicked when he exited and could not find his wife, got shaky, and left the store and he took a Xanax.  He has had to use more Xanax than normal since the last refill.  He noted he has about 9 left at this point. He has not seen anybody from counseling, and noted he did look somebody up that is covered by his insurance, and just has not called to make an appointment. He has been battling intermittent anxiety and depressive symptoms for years now, at least since 2010.  He has a strong family history of this including parental, sister, aunt, although still with anxiety.  He notes his  mom is on Effexor, and that is helpful. He noted he was on Effexor in the past, 150 mg, and also felt better on this medicine, although was feeling more fatigued, felt possibly due to that medicine, and that is why they stopped and made a change. With his panic episodes, he does get some heart racing, shortness of breath symptoms, shaky, but denies any of these types of symptoms without these panic episodes.  Denies any significant cardiac history. He stopped alcohol about 8 months ago, no smoking tobacco, does use chew. He also works the late shifts at night, which makes his sleep cycles more challenging.  He is unclear if he was a little more fatigue related to that more than the medicine. He denies any history of thoughts of hurting self or hurting others, and none  presently.  Of note, in early January, he had a nephrolithotomy procedure to have a 69mm kidney stone removed.  The procedure was without complications.  Patient Active Problem List   Diagnosis Date Noted  . Kidney stone 01/31/2019  . Shifting sleep-work schedule 04/30/2018  . Mild persistent asthma without complication 32/44/0102  . Pure hypertriglyceridemia 10/29/2017  . Generalized anxiety disorder 10/29/2017  . Overweight (BMI 25.0-29.9) 10/29/2017  . Fatigue 10/29/2017  . Gross hematuria 04/29/2017  . Nephrolithiasis 04/29/2017      Current Outpatient Medications:  .  ALPRAZolam (XANAX) 0.25 MG tablet, Take 1 tablet (0.25 mg total) by mouth 2 (two) times daily as needed for anxiety. To last 1 year., Disp: 15 tablet, Rfl: 0 .  escitalopram (LEXAPRO) 20 MG tablet, Take 1 tablet (20 mg total) by mouth daily., Disp: 90 tablet, Rfl: 0 .  Icosapent Ethyl 1 g CAPS, Take 2 capsules (2 g total) by mouth 2 (two) times daily., Disp: 360 capsule, Rfl: 2 .  levocetirizine (XYZAL) 5 MG tablet, Take 1 tablet (5 mg total) by mouth every evening. (Patient taking differently: Take 5 mg by mouth daily as needed for allergies. ), Disp: 90 tablet, Rfl: 1 .  rosuvastatin (CRESTOR) 20 MG tablet, Take 1 tablet (20 mg total) by mouth daily., Disp: 90 tablet, Rfl: 3   No Known Allergies   Past Surgical History:  Procedure Laterality Date  . APPENDECTOMY    . IR URETERAL STENT LEFT NEW ACCESS W/O SEP NEPHROSTOMY CATH  01/31/2019  . NEPHROLITHOTOMY Left 01/31/2019   Procedure: NEPHROLITHOTOMY PERCUTANEOUS/ INTERVENTIONAL RADIOLOGY TO PLACE NEPHROSTOMY TUBE PRIOR;  Surgeon: Raynelle Bring, MD;  Location: [facility];  Service: Urology;  Laterality: Left;     Family History  Problem Relation Age of Onset  . Anxiety disorder Mother   . Anxiety disorder Father   . Kidney disease Neg Hx   . Prostate cancer Neg Hx   . Bladder Cancer Neg Hx   . Kidney cancer Neg Hx      Social History   Tobacco Use  .  Smoking status: Former Smoker    Packs/day: 0.50    Years: 4.00    Pack years: 2.00  . Smokeless tobacco: Current User    Types: Chew  . Tobacco comment: quit 9 years  Substance Use Topics  . Alcohol use: Yes    Comment: rare    With staff assistance, above reviewed with the patient/wife today.  ROS: As per HPI, otherwise no specific complaints on a limited and focused system review   No results found for this or any previous visit (from the past 72 hour(s)).   PHQ2/9: Depression screen PHQ  2/9 04/25/2019 03/29/2019 01/18/2019 12/17/2018 11/24/2018  Decreased Interest 3 2 0 1 1  Down, Depressed, Hopeless 2 1 0 1 1  PHQ - 2 Score 5 3 0 2 2  Altered sleeping 3 0 0 3 3  Tired, decreased energy 3 3 0 1 1  Change in appetite 2 0 0 0 0  Feeling bad or failure about yourself  2 0 0 0 0  Trouble concentrating 1 0 0 0 0  Moving slowly or fidgety/restless 2 0 0 0 0  Suicidal thoughts 0 0 0 0 0  PHQ-9 Score 18 6 0 6 6  Difficult doing work/chores Very difficult Not difficult at all Not difficult at all Somewhat difficult Somewhat difficult  Some recent data might be hidden   PHQ-2/9 Result is positive  GAD 7 : Generalized Anxiety Score 04/25/2019 03/29/2019 01/18/2019 02/22/2018  Nervous, Anxious, on Edge 3 1 0 3  Control/stop worrying 3 0 0 0  Worry too much - different things 3 0 0 3  Trouble relaxing 3 1 0 3  Restless 2 0 0 0  Easily annoyed or irritable 2 3 0 3  Afraid - awful might happen 2 0 0 0  Total GAD 7 Score 18 5 0 12  Anxiety Difficulty Very difficult Somewhat difficult Not difficult at all Somewhat difficult   Above reviewed - positive  Fall Risk: Fall Risk  04/25/2019 03/29/2019 01/18/2019 12/17/2018 11/24/2018  Falls in the past year? 0 0 0 0 0  Number falls in past yr: 0 0 0 0 0  Injury with Fall? 0 0 0 0 0  Follow up - - Falls evaluation completed Falls evaluation completed Falls evaluation completed      Objective:   Vitals:   04/25/19 1447  BP: 110/72    Pulse: 92  Resp: 16  Temp: 97.7 F (36.5 C)  TempSrc: Temporal  SpO2: 99%  Weight: 195 lb 1.6 oz (88.5 kg)  Height: 5\' 10"  (1.778 m)    Body mass index is 27.99 kg/m.  Physical Exam   NAD, masked HEENT - [place]/AT, sclera anicteric, PERRL, EOMI, conj - non-inj'ed, pharynx clear Neck - supple, no adenopathy, no TM, carotids 2+ and = without bruits bilat Car - RRR without m/g/r Pulm- RR and effort normal at rest, CTA without wheeze or rales Abd - soft, NT diffusely Back - no CVA tenderness Skin- no rash noted on exposed areas,  Ext - no LE edema, Neuro/psychiatric - affect was mildly flat, appropriate with conversation  Alert and oriented  Grossly non-focal   Speech normal, not rapid  Results for orders placed or performed during the hospital encounter of 02/03/19  CBC with Differential  Result Value Ref Range   WBC 16.1 (H) 4.0 - 10.5 K/uL   RBC 5.10 4.22 - 5.81 MIL/uL   Hemoglobin 14.7 13.0 - 17.0 g/dL   HCT 16.146.3 09.639.0 - 04.552.0 %   MCV 90.8 80.0 - 100.0 fL   MCH 28.8 26.0 - 34.0 pg   MCHC 31.7 30.0 - 36.0 g/dL   RDW 40.914.0 81.111.5 - 91.415.5 %   Platelets 295 150 - 400 K/uL   nRBC 0.0 0.0 - 0.2 %   Neutrophils Relative % 74 %   Neutro Abs 11.9 (H) 1.7 - 7.7 K/uL   Lymphocytes Relative 15 %   Lymphs Abs 2.5 0.7 - 4.0 K/uL   Monocytes Relative 9 %   Monocytes Absolute 1.4 (H) 0.1 - 1.0 K/uL   Eosinophils Relative  1 %   Eosinophils Absolute 0.1 0.0 - 0.5 K/uL   Basophils Relative 0 %   Basophils Absolute 0.1 0.0 - 0.1 K/uL   Immature Granulocytes 1 %   Abs Immature Granulocytes 0.10 (H) 0.00 - 0.07 K/uL  Basic metabolic panel  Result Value Ref Range   Sodium 137 135 - 145 mmol/L   Potassium 4.5 3.5 - 5.1 mmol/L   Chloride 98 98 - 111 mmol/L   CO2 29 22 - 32 mmol/L   Glucose, Bld 92 70 - 99 mg/dL   BUN 15 6 - 20 mg/dL   Creatinine, Ser 7.78 0.61 - 1.24 mg/dL   Calcium 9.9 8.9 - 24.2 mg/dL   GFR calc non Af Amer >60 >60 mL/min   GFR calc Af Amer >60 >60 mL/min   Anion  gap 10 5 - 15  Urinalysis, Routine w reflex microscopic  Result Value Ref Range   Color, Urine YELLOW YELLOW   APPearance HAZY (A) CLEAR   Specific Gravity, Urine 1.016 1.005 - 1.030   pH 6.0 5.0 - 8.0   Glucose, UA NEGATIVE NEGATIVE mg/dL   Hgb urine dipstick LARGE (A) NEGATIVE   Bilirubin Urine NEGATIVE NEGATIVE   Ketones, ur NEGATIVE NEGATIVE mg/dL   Protein, ur 30 (A) NEGATIVE mg/dL   Nitrite NEGATIVE NEGATIVE   Leukocytes,Ua TRACE (A) NEGATIVE   RBC / HPF >50 (H) 0 - 5 RBC/hpf   WBC, UA 6-10 0 - 5 WBC/hpf   Bacteria, UA NONE SEEN NONE SEEN   Mucus PRESENT   I-stat chem 8, ED (not at [facility] or [facility])  Result Value Ref Range   Sodium 138 135 - 145 mmol/L   Potassium 4.6 3.5 - 5.1 mmol/L   Chloride 99 98 - 111 mmol/L   BUN 16 6 - 20 mg/dL   Creatinine, Ser 3.53 0.61 - 1.24 mg/dL   Glucose, Bld 92 70 - 99 mg/dL   Calcium, Ion 6.14 4.31 - 1.40 mmol/L   TCO2 30 22 - 32 mmol/L   Hemoglobin 16.3 13.0 - 17.0 g/dL   HCT 54.0 08.6 - 76.1 %       Assessment & Plan:   1. Moderate episode of recurrent major depressive disorder (HCC) Concern with more depressive symptoms in combination with his anxiety, and has a long history of generalized anxiety disorder as well as a strong family history.  The Lexapro does not seem to be helping, and increasing the dose increased side effects, and he is very anxious to try a different medicine at this point.  No SI/HI concerns. Discussed options, and he really was anxious to go back to 1 he was comfortable with that was effective, and wanted to go back on the Effexor.  It was unclear how much of the fatigue type symptoms was related to that medicine.  With his mom having benefits from this, and some genetics possibly playing a role, do feel is reasonable.  Did discuss other medicines such as sertraline, Celexa, Wellbutrin, although he seemed more comfortable returning to the Effexor product. Discussed the importance of having somebody from  counseling/therapy involved in addition to medication as the 2 together work better than either alone and he noted he will call and make that appointment to be seen. Also discussed the potential psychiatry involvement, and may need over time as I outlined today, and await his response to the above. - venlafaxine XR (EFFEXOR XR) 75 MG 24 hr capsule; Take 1 capsule (75 mg total) by mouth  daily with breakfast. After 7 days, increase to two tabs daily.  Dispense: 60 capsule; Refill: 1 Did note if he is more fatigued again taking the Effexor, will want to check some labs on his follow-up visit or sooner if needed, to help ensure there are not other causes to the fatigue such as anemia, thyroid issues He will start the Effexor at 75 mg daily for a week, and increase to 2 daily , his prior dose after 1 week.   Given he is already weaned from the 20 mg dose to the 10 mg dose of Lexapro, do feel he can stop that medicine as he starts the Effexor.  2. Generalized anxiety disorder As above - venlafaxine XR (EFFEXOR XR) 75 MG 24 hr capsule; Take 1 capsule (75 mg total) by mouth daily with breakfast. After 7 days, increase to two tabs daily.  Dispense: 60 capsule; Refill: 1  3. Fatigue As above, await response with resuming the Effexor again as above noted  We will follow-up in 3 to 4 weeks time, sooner as needed.  Also emphasized if he is feeling like things are spiraling downward further, with increasing symptoms, he should follow-up sooner.  His wife was present and supportive of the above, and await his response.    Adam Haring, MD 04/25/19 3:30 PM

## 2019-05-26 NOTE — Progress Notes (Deleted)
Patient is a 29 year old male patient of [name] I first met him on a visit approximately 1 month ago with his wife present after he had a telephone visit with Dr. Ancil Boozer on 03/29/2019 for increasing anxiety concerns     At our last visit, he noted he was feeling more depressed.  He did not tolerate the increased Lexapro dose at 20 mg, caused GI upset, nausea and vomiting and 2 weeks ago, reduced the dose back to 10 mg.  He noted the 10 mg dose had not been helping at all with his symptoms.  Was sleeping a lot, staying at home, apathetic, sad, and also having panic attacks.  He had to return from a planned trip to [facility] after a couple hours as he could not stop crying and felt very panicked away from home.  He had an episode of panic at the [facility] as well noted.   He noted he was having to use more Xanax at that time. He had not seen anybody from counseling, and noted he did look somebody up that was covered by his insurance, and just had not called to make an appointment.  He has been battling intermittent anxiety and depressive symptoms for years, at least since 2010.  He has tried multiple medications in the past including Trintellix, Effexor, alprazolam, Xanax, BuSpar,  more recently on Lexapro  He has a strong family history of this including parental, sister, aunt. He notes his mom is on Effexor, and that is helpful. He noted while he was on Effexor in the past, 150 mg,  felt better on this medicine, although was feeling more fatigued, felt possibly due to that medicine, and that is why they stopped and made a change. With his panic episodes, he does get some heart racing, shortness of breath symptoms, shaky. Denies any significant cardiac history. The Effexor was reinitiated last visit, with Effexor XR 75 mg recommended daily for a week, and then increasing to 2 tabs daily after.  Also noted may need to check some labs pending his status on follow-up visits.  Since our last  visit,    He stopped alcohol about 8 months ago, no smoking tobacco, does use chew. He also works the late shifts at night, which makes his sleep cycles more challenging. He denies any history of thoughts of hurting self or hurting others, and none presently.  Of note, in early January, he had a nephrolithotomy procedure to havea 43m kidney stone removed.  The procedure was without complications.  1. Moderate episode of recurrent major depressive disorder ([place] Concern with more depressive symptoms in combination with his anxiety, and has a long history of generalized anxiety disorder as well as a strong family history.  The Lexapro does not seem to be helping, and increasing the dose increased side effects, and he is very anxious to try a different medicine at this point.  No SI/HI concerns. Discussed options, and he really was anxious to go back to 1 he was comfortable with that was effective, and wanted to go back on the Effexor.  It was unclear how much of the fatigue type symptoms was related to that medicine.  With his mom having benefits from this, and some genetics possibly playing a role, do feel is reasonable.  Did discuss other medicines such as sertraline, Celexa, Wellbutrin, although he seemed more comfortable returning to the Effexor product. Discussed the importance of having somebody from counseling/therapy involved in addition to medication as the 2 together  work better than either alone and he noted he will call and make that appointment to be seen. Also discussed the potential psychiatry involvement, and may need over time as I outlined today, and await his response to the above. - venlafaxine XR (EFFEXOR XR) 75 MG 24 hr capsule; Take 1 capsule (75 mg total) by mouth daily with breakfast. After 7 days, increase to two tabs daily.  Dispense: 60 capsule; Refill: 1 Did note if he is more fatigued again taking the Effexor, will want to check some labs on his follow-up visit or  sooner if needed, to help ensure there are not other causes to the fatigue such as anemia, thyroid issues He will start the Effexor at 75 mg daily for a week, and increase to 2 daily , his prior dose after 1 week.   Given he is already weaned from the 20 mg dose to the 10 mg dose of Lexapro, do feel he can stop that medicine as he starts the Effexor.  2. Generalized anxiety disorder As above - venlafaxine XR (EFFEXOR XR) 75 MG 24 hr capsule; Take 1 capsule (75 mg total) by mouth daily with breakfast. After 7 days, increase to two tabs daily.  Dispense: 60 capsule; Refill: 1  3. Fatigue As above, await response with resuming the Effexor again as above noted  We will follow-up in 3 to 4 weeks time, sooner as needed.  Also emphasized if he is feeling like things are spiraling downward further, with increasing symptoms, he should follow-up sooner.  His wife was present and supportive of the above, and await his response.

## 2019-05-27 ENCOUNTER — Ambulatory Visit: Payer: Managed Care, Other (non HMO) | Admitting: Internal Medicine

## 2019-06-15 ENCOUNTER — Other Ambulatory Visit: Payer: Self-pay

## 2019-06-15 DIAGNOSIS — F331 Major depressive disorder, recurrent, moderate: Secondary | ICD-10-CM

## 2019-06-15 DIAGNOSIS — F411 Generalized anxiety disorder: Secondary | ICD-10-CM

## 2019-06-15 MED ORDER — VENLAFAXINE HCL ER 75 MG PO CP24: ORAL_CAPSULE | ORAL | 0 refills | Status: DC

## 2019-06-21 ENCOUNTER — Other Ambulatory Visit: Payer: Self-pay | Admitting: Family Medicine

## 2019-06-21 DIAGNOSIS — F411 Generalized anxiety disorder: Secondary | ICD-10-CM

## 2019-06-21 NOTE — Telephone Encounter (Signed)
Requested medications are due for refill today?  Last office note by Dr. Dorris Fetch indicated patient was tapering off Escitalopram (Lexapro).  Patient was switching to Effexor.    Requested medications are on active medication list?  Yes  Last Refill:  03/29/2019  # 90 with no refills  Future visit scheduled?  Yes on 06/22/2019  Notes to Clinic:  See above.  Patient is due to be seen tomorrow.

## 2019-06-21 NOTE — Progress Notes (Signed)
Name: Adam Palmer   MRN: [id number]    DOB: Jul 21, 1990   Date:06/22/2019       Progress Note  Subjective  Chief Complaint  Chief Complaint  Patient presents with  . Follow-up  . Anxiety    GAD 1  . Depression    PHQ 2    I connected with  [name] on 06/22/19 at  8:00 AM EDT by telephone and verified that I am speaking with the correct person using two identifiers.  I discussed the limitations, risks, security and privacy concerns of performing an evaluation and management service by telephone and the availability of in person appointments. Staff also discussed with the patient that there may be a patient responsible charge related to this service. Patient Location: Home Provider Location: [facility] Additional Individuals present: none  HPI  Patient is a 29 year old male patient of Adam Palmer I first met him on a visit 04/25/19 with his wife present after he had a telephone visit with Dr. Ancil Boozer on 03/29/2019 for increasing anxiety concerns He follows up today by telephone visit   At our last visit, he noted he was feeling more depressed.  He did not tolerate the increased Lexapro dose at 20 mg, caused GI upset, nausea and vomiting and 2 weeks ago, reduced the dose back to 10 mg.  He noted the 10 mg dose had not been helping at all with his symptoms.  Was sleeping a lot, staying at home, apathetic, sad, and also having panic attacks.  He had to return from a planned trip to [facility] after a couple hours as he could not stop crying and felt very panicked away from home.  He had an episode of panic at the [facility] as well noted.   He noted he was having to use more Xanax at that time. He had not seen anybody from counseling, and noted he did look somebody up that was covered by his insurance, and just had not called to make an appointment.   He also noted last visit that he  has been battling intermittent anxiety and depressive symptoms for years, at least since  2010.  He has tried multiple medications in the past including Trintellix, Effexor, alprazolam, Xanax, BuSpar,  more recently on Lexapro  He has a strong family history of this including parental, sister, aunt. He notes his mom is on Effexor, and that is helpful. He noted while he was on Effexor in the past, 150 mg,  felt better on this medicine, although was feeling more fatigued, felt possibly due to that medicine, and that is why they stopped and made a change.  The Effexor was reinitiated last visit, with Effexor XR 75 mg recommended daily for a week, and then increasing to 2 tabs daily after.     Since our last visit, patient notes that her symptoms have markedly improved.  The first couple weeks on the Effexor, he had some nausea, headaches, and the symptoms did go away.  He notes he has been feeling much improved, with no panic episodes in the past month plus, and much less depressive type symptoms.  PHQ-9 and GAD-7 were reviewed with him today.  These are much improved.  Also less fatigue. He noted he thinks getting back on the Effexor product was very helpful.  He notes his allergy symptoms are more problematic, and takes Flonase daily and needs another refill.  He uses the Xyzal product as needed, and also has an  albuterol inhaler to use as needed and with allergies increasing, he has used this a little more frequently recently.  He states he needs it about 2-3 times a week at most, and is helpful.  He does request a refill of his albuterol inhaler as well.  He notes the allergy symptoms are seasonal, increased often with change of seasons.   He stopped alcohol about 8 months ago,  no smoking tobacco, does use chew still, trying to quit and encouraged again today. He also continues to work the late shifts at night, which makes his sleep cycles more challenging.    Of note, in early January, he had a nephrolithotomy procedure to have a 51m kidney stone removed.  The procedure was without  complications and he has had no recurrent symptoms in the more recent past.    Patient Active Problem List   Diagnosis Date Noted  . Moderate episode of recurrent major depressive disorder ([place] 04/25/2019  . Kidney stone 01/31/2019  . Shifting sleep-work schedule 04/30/2018  . Mild persistent asthma without complication 076/[id number] . Pure hypertriglyceridemia 10/29/2017  . Generalized anxiety disorder 10/29/2017  . Overweight (BMI 25.0-29.9) 10/29/2017  . Fatigue 10/29/2017  . Gross hematuria 04/29/2017  . Nephrolithiasis 04/29/2017    Past Surgical History:  Procedure Laterality Date  . APPENDECTOMY    . IR URETERAL STENT LEFT NEW ACCESS W/O SEP NEPHROSTOMY CATH  01/31/2019  . NEPHROLITHOTOMY Left 01/31/2019   Procedure: NEPHROLITHOTOMY PERCUTANEOUS/ INTERVENTIONAL RADIOLOGY TO PLACE NEPHROSTOMY TUBE PRIOR;  Surgeon: BRaynelle Bring MD;  Location: [facility];  Service: Urology;  Laterality: Left;    Family History  Problem Relation Age of Onset  . Anxiety disorder Mother   . Anxiety disorder Father   . Kidney disease Neg Hx   . Prostate cancer Neg Hx   . Bladder Cancer Neg Hx   . Kidney cancer Neg Hx     Social History   Tobacco Use  . Smoking status: Former Smoker    Packs/day: 0.50    Years: 4.00    Pack years: 2.00  . Smokeless tobacco: Current User    Types: Chew  . Tobacco comment: quit 9 years  Substance Use Topics  . Alcohol use: Yes    Comment: rare     Current Outpatient Medications:  .  ALPRAZolam (XANAX) 0.25 MG tablet, Take 1 tablet (0.25 mg total) by mouth 2 (two) times daily as needed for anxiety. To last 1 year., Disp: 15 tablet, Rfl: 0 .  levocetirizine (XYZAL) 5 MG tablet, Take 1 tablet (5 mg total) by mouth every evening. (Patient taking differently: Take 5 mg by mouth daily as needed for allergies. ), Disp: 90 tablet, Rfl: 1 .  venlafaxine XR (EFFEXOR XR) 75 MG 24 hr capsule, Take two tabs daily, Disp: 60 capsule, Rfl: 0 .  escitalopram (LEXAPRO)  20 MG tablet, Take 1 tablet (20 mg total) by mouth daily., Disp: 90 tablet, Rfl: 0 .  Icosapent Ethyl 1 g CAPS, Take 2 capsules (2 g total) by mouth 2 (two) times daily., Disp: 360 capsule, Rfl: 2 .  rosuvastatin (CRESTOR) 20 MG tablet, Take 1 tablet (20 mg total) by mouth daily., Disp: 90 tablet, Rfl: 3  No Known Allergies  With staff assistance, above reviewed with the patient/caregiver today.  ROS: As per HPI, otherwise no specific complaints on a limited and focused system review   Objective  Virtual encounter, vitals not obtained.  Body mass index is 25.83 kg/m.  Physical Exam  Appears in NAD via conversation Breathing: No obvious respiratory distress. Speaking in complete sentences Neurological: Pt is alert and oriented, Speech is normal Psychiatric: Patient has a normal mood and affect, Judgment and thought content normal.   No results found for this or any previous visit (from the past 72 hour(s)).  PHQ2/9: Depression screen [facility] 2/9 06/22/2019 04/25/2019 03/29/2019 01/18/2019 12/17/2018  Decreased Interest 0 3 2 0 1  Down, Depressed, Hopeless 0 2 1 0 1  PHQ - 2 Score 0 5 3 0 2  Altered sleeping 1 3 0 0 3  Tired, decreased energy _0 0 1  Change in appetite 0 2 0 0 0  Feeling bad or failure about yourself  0 2 0 0 0  Trouble concentrating 0 1 0 0 0  Moving slowly or fidgety/restless 0 2 0 0 0  Suicidal thoughts 0 0 0 0 0  PHQ-9 Score _1 0 6  Difficult doing work/chores Not difficult at all Very difficult Not difficult at all Not difficult at all Somewhat difficult  Some recent data might be hidden   PHQ-2/9 Result reviewed, much improved  GAD 7 : Generalized Anxiety Score 06/22/2019 04/25/2019 03/29/2019 01/18/2019  Nervous, Anxious, on Edge _2 0  Control/stop worrying 0 3 0 0  Worry too much - different things 0 3 0 0  Trouble relaxing 0 3 1 0  Restless 0 2 0 0  Easily annoyed or irritable 0 2 3 0  Afraid - awful might happen 0 2 0 0  Total GAD 7 Score _3 0  Anxiety Difficulty Not difficult at all Very difficult Somewhat difficult Not difficult at all    Result reviewed, much improved  Fall Risk: Fall Risk  06/22/2019 04/25/2019 03/29/2019 01/18/2019 12/17/2018  Falls in the past year? 0 0 0 0 0  Number falls in past yr: 0 0 0 0 0  Injury with Fall? 0 0 0 0 0  Follow up - - - Falls evaluation completed Falls evaluation completed     Assessment & Plan 1. Seasonal allergic rhinitis due to pollen Refill the Flonase product and to continue. Also has the Xyzal product to use to help as needed. - fluticasone (FLONASE) 50 MCG/ACT nasal spray; Place 2 sprays into both nostrils daily.  Dispense: 16 g; Refill: 5  2. Mild persistent asthma without complication Refill the albuterol inhaler to use as needed, and emphasized if he is needing it on a daily basis the need to follow-up and reassess.  It seems to be more of an allergic type asthma that is more problematic when his allergy symptoms are increased. Giving the allergy symptoms better controlled is important to helping with his asthma type symptoms, and may consider a Singulair type product in the near future pending his response. - albuterol (VENTOLIN HFA) 108 (90 Base) MCG/ACT inhaler; Inhale 2 puffs into the lungs every 6 (six) hours as needed for wheezing or shortness of breath.  Dispense: 6.7 g; Refill: 3  3. Moderate episode of recurrent major depressive disorder (HCC) Symptoms are much improved, with the PHQ-9 much improved. He is tolerating the Effexor XR dose without side effect concerns, and continue Will refill at the 150 mg dose so he only has to take 1 tablet daily, for his next refill. - venlafaxine XR (EFFEXOR XR) 150 MG 24 hr capsule; Take two tabs daily  Dispense: 90 capsule; Refill: 1 A refill request recently came through for Lexapro,  and this was not refilled.  4. Generalized anxiety disorder Symptoms are much improved, with the GAD-7 much improved. Tolerating the  Effexor XR dose and continue as noted above. - venlafaxine XR (EFFEXOR XR) 150 MG 24 hr capsule; Take two tabs daily  Dispense: 90 capsule; Refill: 1  5. Shifting sleep-work schedule Still working the late night shifts, and noted that can be problematic with sleep cycles and the above diagnoses.  Continuing to monitor.  Recommended a follow-up in approximately 6 months time, sooner as needed.   I discussed the assessment and treatment plan with the patient. The patient was provided an opportunity to ask questions and all were answered. The patient agreed with the plan and demonstrated an understanding of the instructions.   The patient was advised to call back or seek an in-person evaluation if the symptoms worsen or if the condition fails to improve as anticipated.  I provided 20 minutes of non-face-to-face time during this encounter that included discussing at length patient's sx/history, pertinent pmhx, medications, treatment and follow up plan. This time also included the necessary documentation, orders, and chart review.  Towanda Malkin, MD

## 2019-06-22 ENCOUNTER — Encounter: Payer: Self-pay | Admitting: Internal Medicine

## 2019-06-22 ENCOUNTER — Ambulatory Visit (INDEPENDENT_AMBULATORY_CARE_PROVIDER_SITE_OTHER): Payer: Managed Care, Other (non HMO) | Admitting: Internal Medicine

## 2019-06-22 ENCOUNTER — Other Ambulatory Visit: Payer: Self-pay

## 2019-06-22 ENCOUNTER — Telehealth: Payer: Self-pay

## 2019-06-22 VITALS — Ht 70.0 in | Wt 180.0 lb

## 2019-06-22 DIAGNOSIS — J4530 Mild persistent asthma, uncomplicated: Secondary | ICD-10-CM

## 2019-06-22 DIAGNOSIS — F411 Generalized anxiety disorder: Secondary | ICD-10-CM | POA: Diagnosis not present

## 2019-06-22 DIAGNOSIS — G4726 Circadian rhythm sleep disorder, shift work type: Secondary | ICD-10-CM

## 2019-06-22 DIAGNOSIS — J301 Allergic rhinitis due to pollen: Secondary | ICD-10-CM

## 2019-06-22 DIAGNOSIS — F331 Major depressive disorder, recurrent, moderate: Secondary | ICD-10-CM | POA: Diagnosis not present

## 2019-06-22 MED ORDER — VENLAFAXINE HCL ER 150 MG PO CP24: ORAL_CAPSULE | ORAL | 1 refills | Status: DC

## 2019-06-22 MED ORDER — ALBUTEROL SULFATE HFA 108 (90 BASE) MCG/ACT IN AERS: 2.0000 | INHALATION_SPRAY | Freq: Four times a day (QID) | RESPIRATORY_TRACT | 3 refills | Status: DC | PRN

## 2019-06-22 MED ORDER — FLUTICASONE PROPIONATE 50 MCG/ACT NA SUSP: 2.0000 | Freq: Every day | NASAL | 5 refills | Status: DC

## 2019-06-22 NOTE — Telephone Encounter (Signed)
Pharmacy calling to confirm Venlafaxine XR 150mg  has been changed from 2 tabs daily to one tablet daily. (per new script written it is one tab daily).

## 2020-02-04 ENCOUNTER — Other Ambulatory Visit: Payer: Self-pay

## 2020-02-04 ENCOUNTER — Other Ambulatory Visit: Payer: Managed Care, Other (non HMO)

## 2020-02-04 DIAGNOSIS — Z20822 Contact with and (suspected) exposure to covid-19: Secondary | ICD-10-CM

## 2020-02-06 LAB — NOVEL CORONAVIRUS, NAA: SARS-CoV-2, NAA: NOT DETECTED

## 2020-02-06 LAB — SARS-COV-2, NAA 2 DAY TAT

## 2020-02-08 IMAGING — CR DG CHEST 2V
1 series · 2 of 2 positions shown · non-contrast
Comparison: 02/06/2007

CLINICAL DATA: Cough and fever

EXAM:
CHEST - 2 VIEW

[Series 1: dg chest 2 view · 0.14mm/px · 2 of 2 slices shown]
[im 1/2]
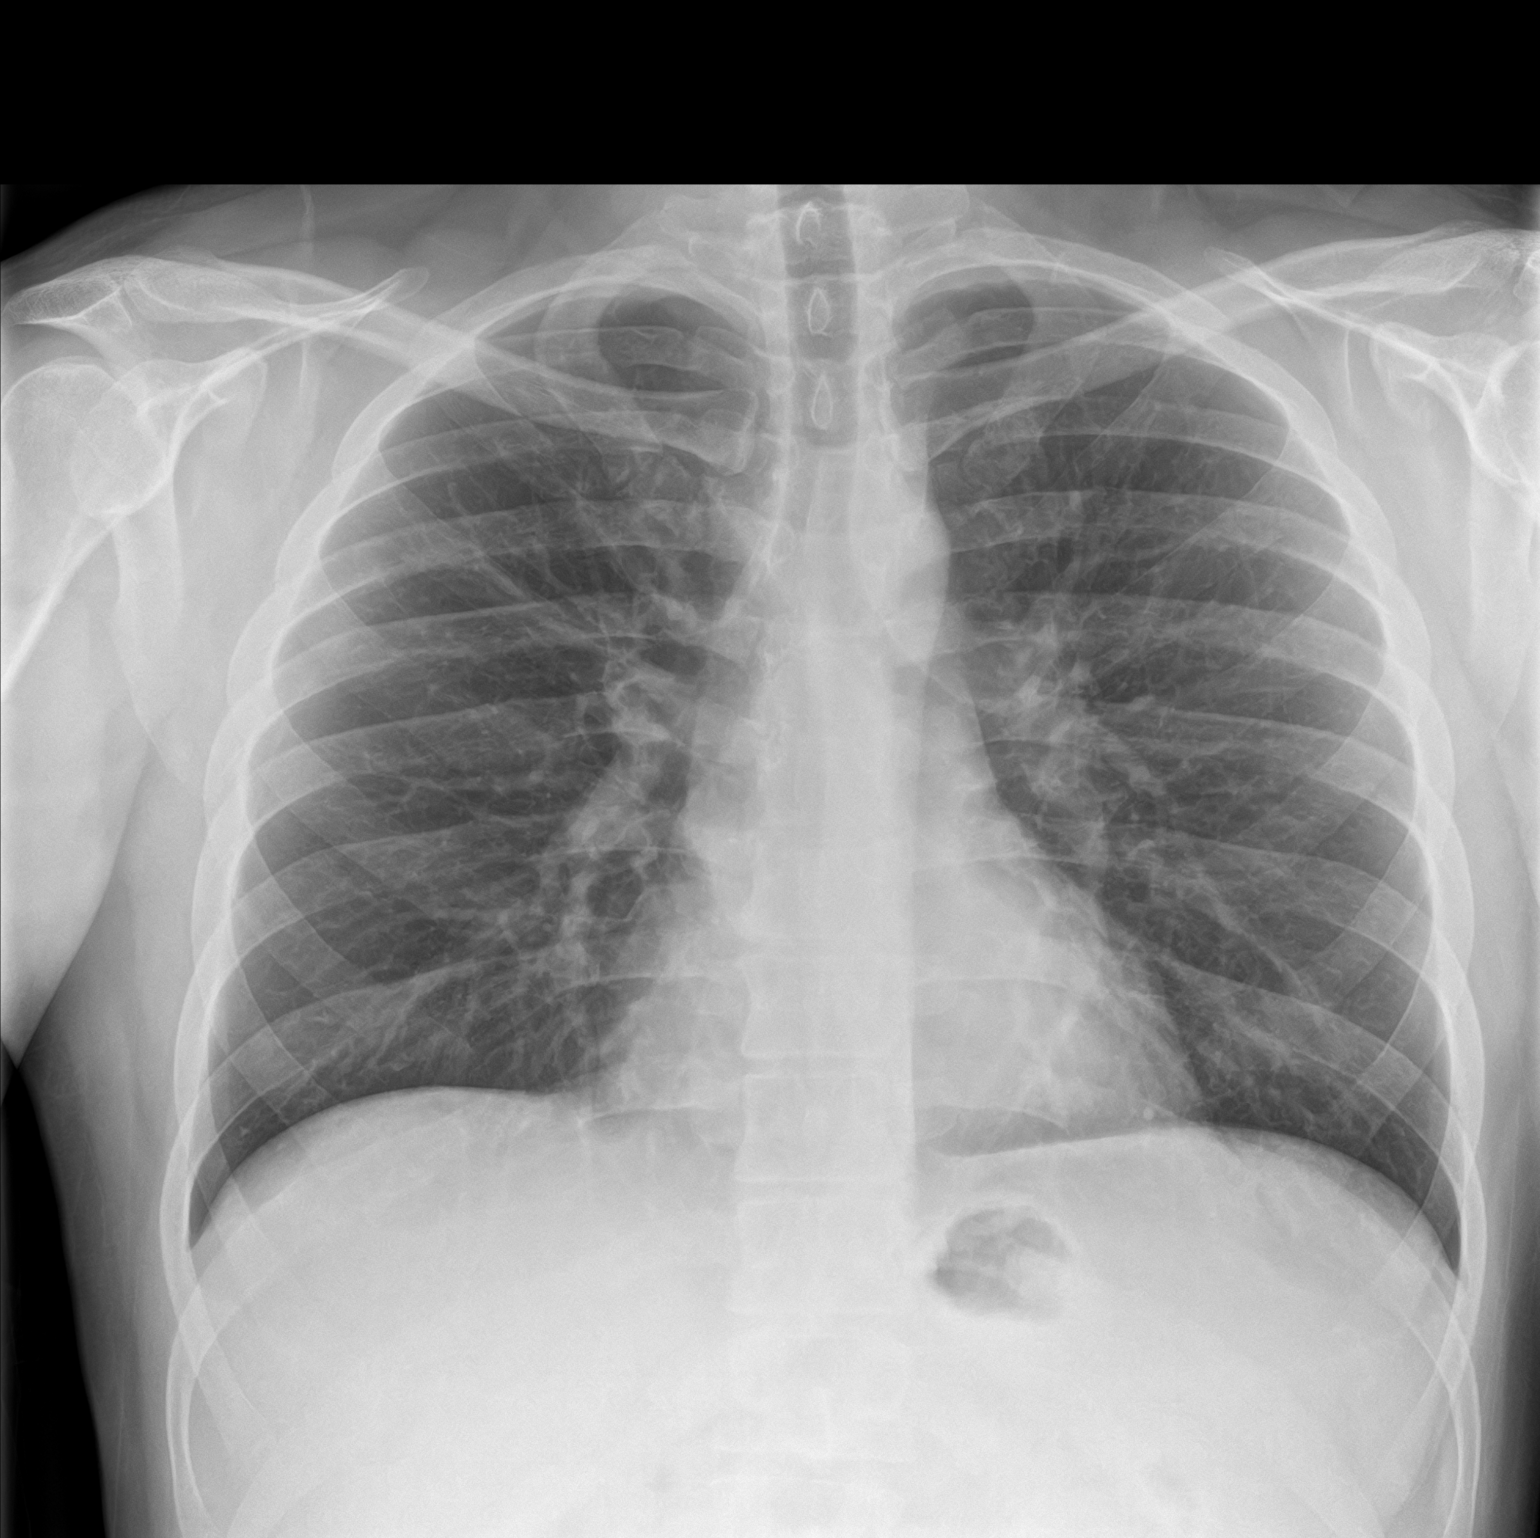
[im 2/2]
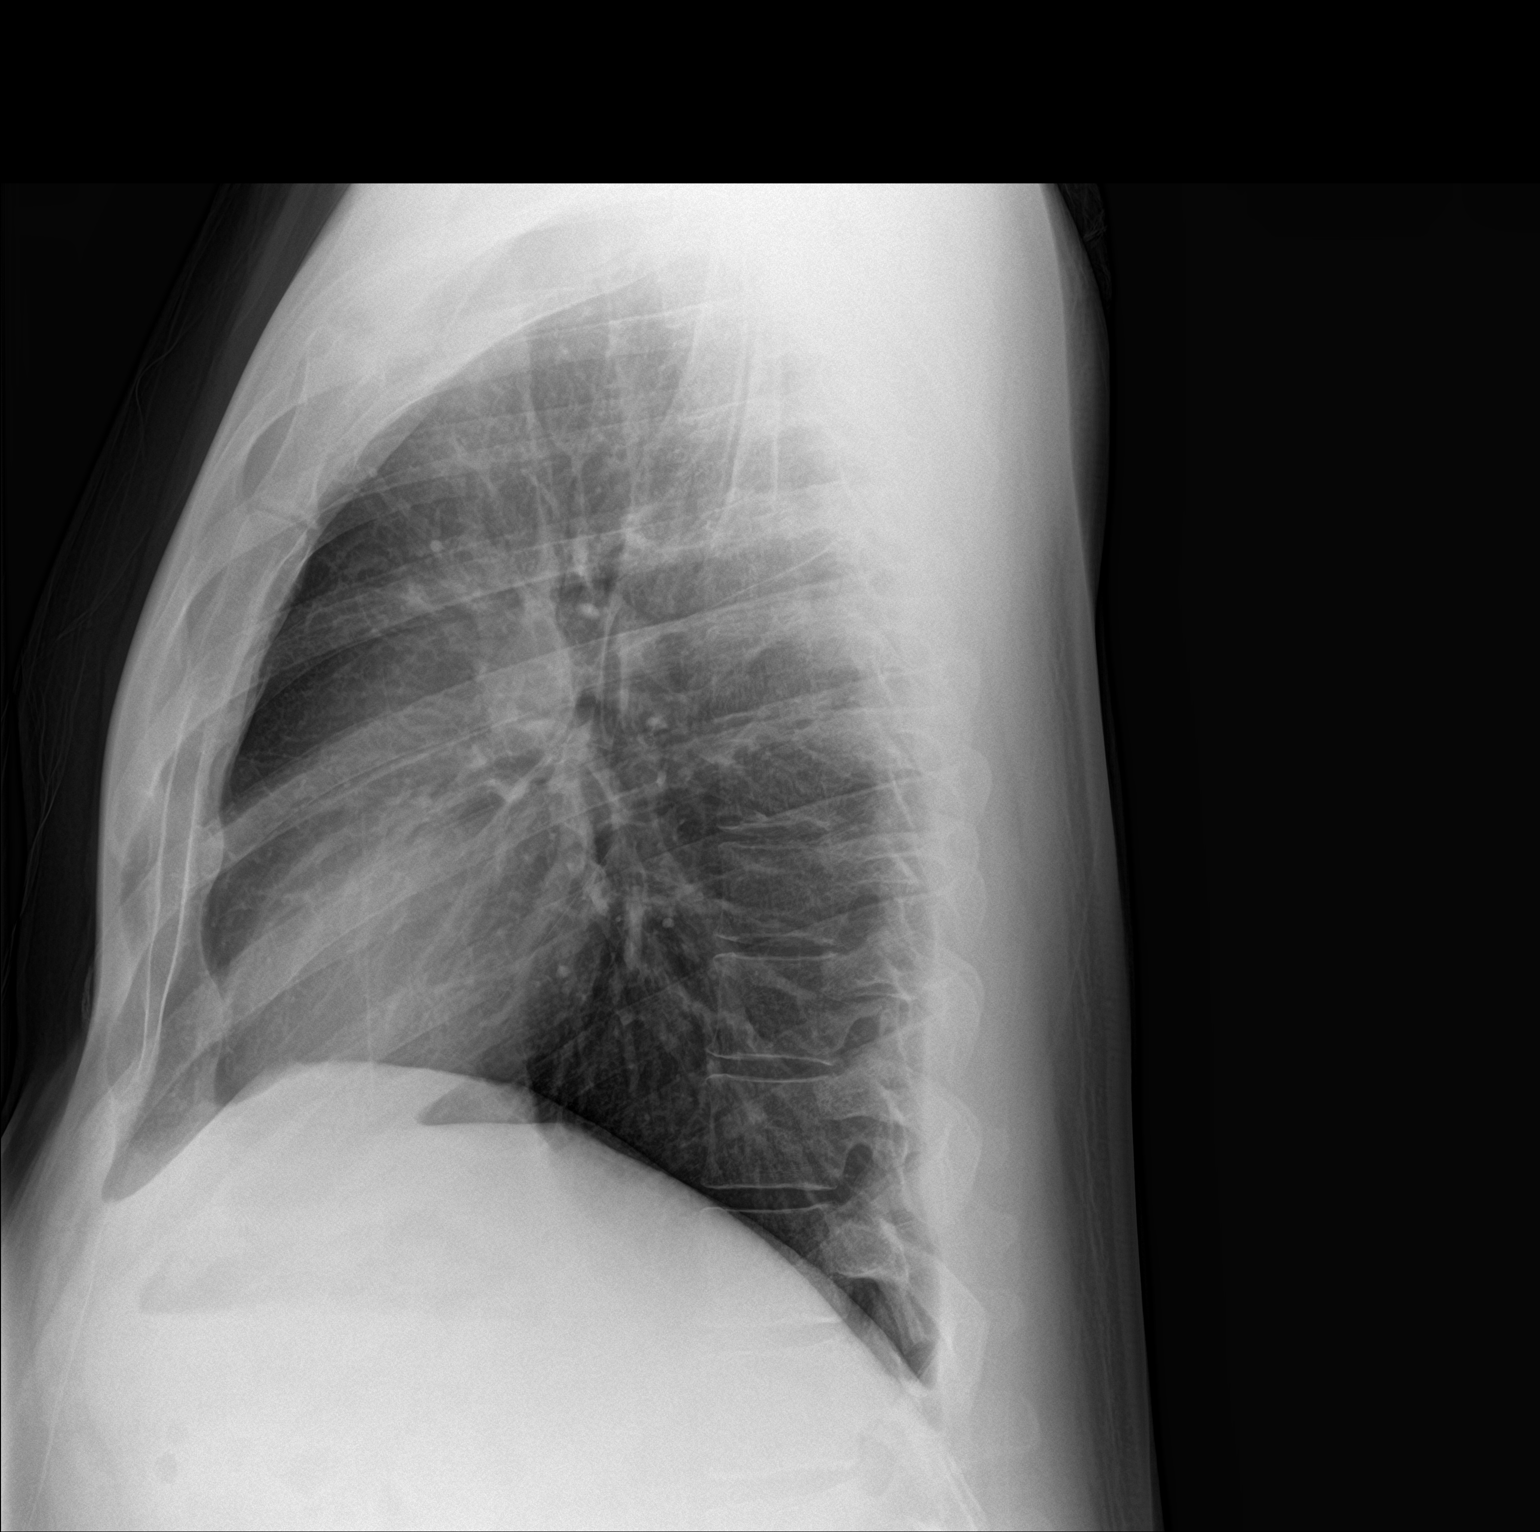

[2 of 2 positions shown; findings below may reference images not displayed]

FINDINGS: The heart size and mediastinal contours are within normal limits.
Both lungs are clear. The visualized skeletal structures are
unremarkable.
IMPRESSION: No active cardiopulmonary disease.

## 2020-03-16 ENCOUNTER — Other Ambulatory Visit: Payer: Self-pay | Admitting: Family Medicine

## 2020-03-16 DIAGNOSIS — F331 Major depressive disorder, recurrent, moderate: Secondary | ICD-10-CM

## 2020-03-16 DIAGNOSIS — F411 Generalized anxiety disorder: Secondary | ICD-10-CM

## 2020-03-16 MED ORDER — VENLAFAXINE HCL ER 150 MG PO CP24: ORAL_CAPSULE | ORAL | 0 refills | Status: DC

## 2020-03-16 NOTE — Telephone Encounter (Signed)
Medication: venlafaxine XR (EFFEXOR XR) 150 MG 24 hr capsule  Has the pt contacted their pharmacy? Yes  but had not heard anything back Pt states he has only 2 pills left. Pt has appt 03/27/20. Can you please send him enough to  Get through to this appt/ this was first available appt.  Preferred pharmacy: [id number] [place], [place] - [address]  Please be advised refills may take up to 3 business days.  We ask that you follow up with your pharmacy.

## 2020-03-27 ENCOUNTER — Ambulatory Visit (INDEPENDENT_AMBULATORY_CARE_PROVIDER_SITE_OTHER): Payer: Managed Care, Other (non HMO) | Admitting: Family Medicine

## 2020-03-27 ENCOUNTER — Encounter: Payer: Self-pay | Admitting: Family Medicine

## 2020-03-27 ENCOUNTER — Other Ambulatory Visit: Payer: Self-pay

## 2020-03-27 VITALS — BP 122/90 | HR 97 | Temp 98.5°F | Resp 16 | Ht 70.0 in | Wt 194.3 lb

## 2020-03-27 DIAGNOSIS — F331 Major depressive disorder, recurrent, moderate: Secondary | ICD-10-CM | POA: Diagnosis not present

## 2020-03-27 DIAGNOSIS — F411 Generalized anxiety disorder: Secondary | ICD-10-CM

## 2020-03-27 DIAGNOSIS — J301 Allergic rhinitis due to pollen: Secondary | ICD-10-CM

## 2020-03-27 DIAGNOSIS — J4530 Mild persistent asthma, uncomplicated: Secondary | ICD-10-CM

## 2020-03-27 DIAGNOSIS — E781 Pure hyperglyceridemia: Secondary | ICD-10-CM

## 2020-03-27 DIAGNOSIS — N200 Calculus of kidney: Secondary | ICD-10-CM

## 2020-03-27 LAB — LIPID PANEL
Cholesterol: 321 mg/dL — ABNORMAL HIGH (ref <200)
HDL: 33 mg/dL — ABNORMAL LOW (ref >=40)
Non-HDL Cholesterol (Calc): 288 mg/dL (calc) — ABNORMAL HIGH (ref <130)
Total CHOL/HDL Ratio: 9.7 (calc) — ABNORMAL HIGH (ref <5.0)
Triglycerides: 806 mg/dL — ABNORMAL HIGH (ref <150)

## 2020-03-27 LAB — BASIC METABOLIC PANEL
BUN: 12 mg/dL (ref 7–25)
CO2: 29 mmol/L (ref 20–32)
Calcium: 10.3 mg/dL (ref 8.6–10.3)
Chloride: 99 mmol/L (ref 98–110)
Creat: 0.82 mg/dL (ref 0.60–1.35)
Glucose, Bld: 78 mg/dL (ref 65–99)
Potassium: 3.8 mmol/L (ref 3.5–5.3)
Sodium: 136 mmol/L (ref 135–146)

## 2020-03-27 MED ORDER — VENLAFAXINE HCL ER 150 MG PO CP24: ORAL_CAPSULE | ORAL | 3 refills | Status: DC

## 2020-03-27 MED ORDER — ALPRAZOLAM 0.25 MG PO TABS: 0.2500 mg | ORAL_TABLET | Freq: Two times a day (BID) | ORAL | 0 refills | Status: DC | PRN

## 2020-03-27 MED ORDER — LEVOCETIRIZINE DIHYDROCHLORIDE 5 MG PO TABS: 5.0000 mg | ORAL_TABLET | Freq: Every day | ORAL | 0 refills | Status: DC | PRN

## 2020-03-27 MED ORDER — FLUTICASONE PROPIONATE 50 MCG/ACT NA SUSP: 2.0000 | Freq: Every day | NASAL | 5 refills | Status: DC

## 2020-03-27 MED ORDER — ALBUTEROL SULFATE HFA 108 (90 BASE) MCG/ACT IN AERS: 2.0000 | INHALATION_SPRAY | Freq: Four times a day (QID) | RESPIRATORY_TRACT | 3 refills | Status: DC | PRN

## 2020-03-27 NOTE — Assessment & Plan Note (Signed)
Doing well on current regimen, no changes made today. 

## 2020-03-27 NOTE — Progress Notes (Signed)
   SUBJECTIVE:   CHIEF COMPLAINT / HPI:   Asthma - Medications: albuterol PRN - Taking: during allergy season, spring - Common triggers: spring environmental allergies - ED visits/hospitalization in the last 6 months: no - Current symptoms: none  Seasonal allergies - Xyzal, flonase.  Anxiety/Depression - Medications: effexor 150mg  daily, xanax 0.25mg  prn  - Previously tried Trintellix, Effexor, Xanax, Buspar, Lexapro - Taking: xanax maybe like once monthly.  - Counseling: no - Previous hospitalizations: no - FH of psych illness: yes, mom with anxiety - Symptoms: none - Current stressors: none - Coping Mechanisms: sleeping  Depression screen [facility] 2/9 03/27/2020 06/22/2019 04/25/2019  Decreased Interest 0 0 3  Down, Depressed, Hopeless 0 0 2  PHQ - 2 Score 0 0 5  Altered sleeping 1 1 3   Tired, decreased energy 1 1 3   Change in appetite 0 0 2  Feeling bad or failure about yourself  0 0 2  Trouble concentrating 0 0 1  Moving slowly or fidgety/restless 0 0 2  Suicidal thoughts 0 0 0  PHQ-9 Score 2 2 18   Difficult doing work/chores Not difficult at all Not difficult at all Very difficult  Some recent data might be hidden   GAD 7 : Generalized Anxiety Score 03/27/2020 06/22/2019 04/25/2019 03/29/2019  Nervous, Anxious, on Edge 1 1 3 1   Control/stop worrying 0 0 3 0  Worry too much - different things 1 0 3 0  Trouble relaxing 0 0 3 1  Restless 0 0 2 0  Easily annoyed or irritable 1 0 2 3  Afraid - awful might happen 0 0 2 0  Total GAD 7 Score 3 1 18 5   Anxiety Difficulty Not difficult at all Not difficult at all Very difficult Somewhat difficult     OBJECTIVE:   BP 122/90   Pulse 97   Temp 98.5 F (36.9 C) (Oral)   Resp 16   Ht 5\' 10"  (1.778 m)   Wt 194 lb 4.8 oz (88.1 kg)   SpO2 97%   BMI 27.88 kg/m   Gen: well appearing, in NAD Card: Reg rate Lungs: Comfortable WOB on RA Ext: WWP  ASSESSMENT/PLAN:   Mild persistent asthma without complication Doing well on  current regimen, no changes made today.  Seasonal allergic rhinitis due to pollen Doing well on current regimen, no changes made today.  Nephrolithiasis Chronic history with prior lithotomy. No current symptoms, continue to follow with urology.  Pure hypertriglyceridemia Recheck labs today.  Generalized anxiety disorder Doing well on current regimen, no changes made today.  Moderate episode of recurrent major depressive disorder (HCC) Doing well on current regimen, no changes made today.    05/27/2020, DO

## 2020-03-27 NOTE — Assessment & Plan Note (Signed)
Recheck labs today. 

## 2020-03-27 NOTE — Assessment & Plan Note (Signed)
Chronic history with prior lithotomy. No current symptoms, continue to follow with urology.

## 2020-03-27 NOTE — Patient Instructions (Signed)
It was great to see you!  Our plans for today:  - No changes to your medications today. - We are checking some labs today, we will release these results to your MyChart.  Take care and seek immediate care sooner if you develop any concerns.   Dr. Naomie Crow  

## 2020-03-27 NOTE — Assessment & Plan Note (Addendum)
Doing well on current regimen, no changes made today. 

## 2020-03-28 ENCOUNTER — Other Ambulatory Visit: Payer: Self-pay | Admitting: Family Medicine

## 2020-03-28 MED ORDER — ROSUVASTATIN CALCIUM 5 MG PO TABS: 5.0000 mg | ORAL_TABLET | Freq: Every day | ORAL | 3 refills | Status: DC

## 2020-08-02 ENCOUNTER — Emergency Department
Admission: EM | Admit: 2020-08-02 | Discharge: 2020-08-02 | Disposition: A | Discharge status: Home / Self Care | Payer: Managed Care, Other (non HMO) | Attending: Emergency Medicine | Admitting: Emergency Medicine

## 2020-08-02 ENCOUNTER — Other Ambulatory Visit: Payer: Self-pay

## 2020-08-02 DIAGNOSIS — Z7951 Long term (current) use of inhaled steroids: Secondary | ICD-10-CM | POA: Insufficient documentation

## 2020-08-02 DIAGNOSIS — J4530 Mild persistent asthma, uncomplicated: Secondary | ICD-10-CM | POA: Insufficient documentation

## 2020-08-02 DIAGNOSIS — M5412 Radiculopathy, cervical region: Secondary | ICD-10-CM | POA: Insufficient documentation

## 2020-08-02 DIAGNOSIS — Z87891 Personal history of nicotine dependence: Secondary | ICD-10-CM | POA: Insufficient documentation

## 2020-08-02 DIAGNOSIS — M62838 Other muscle spasm: Secondary | ICD-10-CM | POA: Diagnosis not present

## 2020-08-02 DIAGNOSIS — M542 Cervicalgia: Secondary | ICD-10-CM | POA: Diagnosis present

## 2020-08-02 MED ORDER — IBUPROFEN 800 MG PO TABS
800.0000 mg | ORAL_TABLET | Freq: Once | ORAL | Status: AC
  Administered 2020-08-02: 800 mg via ORAL
  Filled 2020-08-02: qty 1

## 2020-08-02 MED ORDER — HYDROCODONE-ACETAMINOPHEN 5-325 MG PO TABS
1.0000 | ORAL_TABLET | Freq: Once | ORAL | Status: AC
  Administered 2020-08-02: 1 via ORAL
  Filled 2020-08-02: qty 1

## 2020-08-02 MED ORDER — METHOCARBAMOL 500 MG PO TABS
500.0000 mg | ORAL_TABLET | Freq: Once | ORAL | Status: AC
  Administered 2020-08-02: 500 mg via ORAL
  Filled 2020-08-02: qty 1

## 2020-08-02 MED ORDER — PREDNISONE 10 MG (21) PO TBPK: ORAL_TABLET | ORAL | 0 refills | Status: DC

## 2020-08-02 MED ORDER — PREDNISONE 20 MG PO TABS
60.0000 mg | ORAL_TABLET | Freq: Once | ORAL | Status: AC
  Administered 2020-08-02: 60 mg via ORAL
  Filled 2020-08-02: qty 3

## 2020-08-02 MED ORDER — METHOCARBAMOL 500 MG PO TABS: 500.0000 mg | ORAL_TABLET | Freq: Three times a day (TID) | ORAL | 0 refills | Status: DC | PRN

## 2020-08-02 NOTE — ED Notes (Signed)
Patient discharged to home per MD order. Patient in stable condition, and deemed medically cleared by ED provider for discharge. Discharge instructions reviewed with patient/family using "Teach Back"; verbalized understanding of medication education and administration, and information about follow-up care. Denies further concerns. ° °

## 2020-08-02 NOTE — ED Triage Notes (Signed)
Pt states since yesterday has had neck pain that radiates down right arm, states no injury. Pt states painful to move neck, states pain worse to right neck.

## 2020-08-02 NOTE — Discharge Instructions (Addendum)
You may alternate Tylenol 1000 mg every 6 hours as needed for pain, fever and Ibuprofen 800 mg every 8 hours as needed for pain, fever.  Please take Ibuprofen with food.  Do not take more than 4000 mg of Tylenol (acetaminophen) in a 24 hour period.  You may alternate between heat and ice for your neck pain.

## 2020-08-02 NOTE — ED Provider Notes (Signed)
[facility] Emergency Department Provider Note  ____________________________________________   Event Date/Time   First MD Initiated Contact with Patient 08/02/20 [phone]     (approximate)  I have reviewed the triage vital signs and the nursing notes.   HISTORY  Chief Complaint Torticollis    HPI Adam Palmer is a 30 y.o. male with history of depression, anxiety, kidney stones who presents to the emergency department with complaints of right-sided neck pain that radiates down his right arm for the past several days.  Denies any known injury.  No numbness, tingling or weakness.  No bowel or bladder incontinence.  No urinary retention.  No fever.  No history of previous neck or back surgeries, epidural injections, cancer, diabetes, HIV or IV drug abuse.  Has tried alternating Tylenol, Motrin, heat and ice to this area without relief.  He is right-hand dominant.  Reports his wife drove him here to the emergency department.        Past Medical History:  Diagnosis Date   Anxiety    Depression    History of kidney stones    Kidney calculi     Patient Active Problem List   Diagnosis Date Noted   Seasonal allergic rhinitis due to pollen 06/22/2019   Moderate episode of recurrent major depressive disorder (HCC) 04/25/2019   Kidney stone 01/31/2019   Shifting sleep-work schedule 04/30/2018   Mild persistent asthma without complication 04/30/2018   Pure hypertriglyceridemia 10/29/2017   Generalized anxiety disorder 10/29/2017   Overweight (BMI 25.0-29.9) 10/29/2017   Fatigue 10/29/2017   Gross hematuria 04/29/2017   Nephrolithiasis 04/29/2017    Past Surgical History:  Procedure Laterality Date   APPENDECTOMY     IR URETERAL STENT LEFT NEW ACCESS W/O SEP NEPHROSTOMY CATH  01/31/2019   NEPHROLITHOTOMY Left 01/31/2019   Procedure: NEPHROLITHOTOMY PERCUTANEOUS/ INTERVENTIONAL RADIOLOGY TO PLACE NEPHROSTOMY TUBE PRIOR;  Surgeon: Heloise Purpura, MD;   Location: [facility];  Service: Urology;  Laterality: Left;    Prior to Admission medications   Medication Sig Start Date End Date Taking? Authorizing Provider  methocarbamol (ROBAXIN) 500 MG tablet Take 1 tablet (500 mg total) by mouth every 8 (eight) hours as needed for muscle spasms. 08/02/20  Yes Roland Lipke, Layla Maw, DO  predniSONE (STERAPRED UNI-PAK 21 TAB) 10 MG (21) TBPK tablet Take as directed 08/02/20  Yes Patricia Fargo N, DO  rosuvastatin (CRESTOR) 5 MG tablet Take 1 tablet (5 mg total) by mouth daily. 03/28/20   [name], DO  albuterol (VENTOLIN HFA) 108 (90 Base) MCG/ACT inhaler Inhale 2 puffs into the lungs every 6 (six) hours as needed for wheezing or shortness of breath. 03/27/20   [name], DO  ALPRAZolam Prudy Feeler) 0.25 MG tablet Take 1 tablet (0.25 mg total) by mouth 2 (two) times daily as needed for anxiety. To last 1 year. 03/27/20   [name], DO  fluticasone (FLONASE) 50 MCG/ACT nasal spray Place 2 sprays into both nostrils daily. 03/27/20   [name], DO  levocetirizine (XYZAL) 5 MG tablet Take 1 tablet (5 mg total) by mouth daily as needed for allergies. 03/27/20   [name], DO  venlafaxine XR (EFFEXOR XR) 150 MG 24 hr capsule Take one tab daily 03/27/20   [name], DO    Allergies Patient has no known allergies.  Family History  Problem Relation Age of Onset   Anxiety disorder Mother    Anxiety disorder Father    Kidney disease Neg  Hx    Prostate cancer Neg Hx    Bladder Cancer Neg Hx    Kidney cancer Neg Hx     Social History Social History   Tobacco Use   Smoking status: Former    Packs/day: 0.50    Years: 4.00    Pack years: 2.00    Types: Cigarettes   Smokeless tobacco: Current    Types: Chew   Tobacco comments:    quit 9 years  Vaping Use   Vaping Use: Never used  Substance Use Topics   Alcohol use: Yes    Comment: rare   Drug use: No    Review of Systems Constitutional: No fever. Eyes: No visual changes. ENT:  No sore throat. Cardiovascular: Denies chest pain. Respiratory: Denies shortness of breath. Gastrointestinal: No nausea, vomiting, diarrhea. Genitourinary: Negative for dysuria. Musculoskeletal: Negative for back pain. Skin: Negative for rash. Neurological: Negative for focal weakness or numbness.  ____________________________________________   PHYSICAL EXAM:  VITAL SIGNS: ED Triage Vitals [08/02/20 0235]  Enc Vitals Group     BP (!) 146/97     Pulse Rate 89     Resp 20     Temp 98.4 F (36.9 C)     Temp Source Oral     SpO2 100 %     Weight 190 lb (86.2 kg)     Height 5\' 10"  (1.778 m)     Head Circumference      Peak Flow      Pain Score 8     Pain Loc      Pain Edu?      Excl. in GC?    CONSTITUTIONAL: Alert and responds appropriately to questions. Well-appearing; well-nourished HEAD: Normocephalic, atraumatic EYES: Conjunctivae clear, pupils appear equal ENT: normal nose; moist mucous membranes NECK: Normal range of motion, no meningismus, no midline spinal tenderness or step-off or deformity, tender to palpation over the right trapezius muscles without ecchymosis, soft tissue swelling, redness, warmth, rash or other lesions CARD: Regular rate and rhythm RESP: Normal chest excursion without splinting or tachypnea; no hypoxia or respiratory distress, speaking full sentences ABD/GI: non-distended EXT: Normal ROM in all joints, no major deformities noted, 2+ right radial pulse, no bony deformity or tenderness of the right upper extremity, no redness or warmth, no ecchymosis or soft tissue swelling SKIN: Normal color for age and race, no rashes on exposed skin NEURO: Moves all extremities equally, normal speech, no facial asymmetry noted, normal strength in all muscle groups of the right upper extremity, normal deep tendon reflexes of the right upper extremity, normal sensation throughout both arms, normal gait PSYCH: The patient's mood and manner are appropriate. Grooming  and personal hygiene are appropriate.  ____________________________________________   LABS (all labs ordered are listed, but only abnormal results are displayed)  Labs Reviewed - No data to display ____________________________________________  EKG   ____________________________________________  RADIOLOGY I, Filiberto Wamble, personally viewed and evaluated these images (plain radiographs) as part of my medical decision making, as well as reviewing the written report by the radiologist.  ED MD interpretation:    Official radiology report(s): No results found.  ____________________________________________   PROCEDURES  Procedure(s) performed (including Critical Care):  Procedures  ____________________________________________   INITIAL IMPRESSION / ASSESSMENT AND PLAN / ED COURSE  As part of my medical decision making, I reviewed the following data within the electronic MEDICAL RECORD NUMBER Nursing notes reviewed and incorporated, Old chart reviewed, Notes from prior ED visits, and [place] Controlled Substance Database  Patient here with likely trapezius muscle spasm, cervical radiculopathy.  He has no focal neurologic deficits today.  No red flag symptoms to suggest cervical myelopathy, spinal stenosis, cauda equina, discitis or osteomyelitis, epidural abscess or hematoma, transverse myelitis, multiple sclerosis, fracture.  Recommended he continue alternating Tylenol, ibuprofen as well as heat and ice.  Will discharge with Robaxin as well as a prednisone taper.  Will provide with work note.  He has a PCP for close outpatient follow-up.  Discussed return precautions.  He is comfortable with this plan.  At this time, I do not feel there is any life-threatening condition present. I have reviewed, interpreted and discussed all results (EKG, imaging, lab, urine as appropriate) and exam findings with patient/family. I have reviewed nursing notes and appropriate previous records.  I  feel the patient is safe to be discharged home without further emergent workup and can continue workup as an outpatient as needed. Discussed usual and customary return precautions. Patient/family verbalize understanding and are comfortable with this plan.  Outpatient follow-up has been provided as needed. All questions have been answered.  ____________________________________________   FINAL CLINICAL IMPRESSION(S) / ED DIAGNOSES  Final diagnoses:  Cervical radiculopathy  Trapezius muscle spasm     ED Discharge Orders          Ordered    methocarbamol (ROBAXIN) 500 MG tablet  Every 8 hours PRN        08/02/20 0449    predniSONE (STERAPRED UNI-PAK 21 TAB) 10 MG (21) TBPK tablet        08/02/20 0449            *Please note:  Adam Palmer was evaluated in Emergency Department on 08/02/2020 for the symptoms described in the history of present illness. He was evaluated in the context of the global COVID-19 pandemic, which necessitated consideration that the patient might be at risk for infection with the SARS-CoV-2 virus that causes COVID-19. Institutional protocols and algorithms that pertain to the evaluation of patients at risk for COVID-19 are in a state of rapid change based on information released by regulatory bodies including the CDC and federal and state organizations. These policies and algorithms were followed during the patient's care in the ED.  Some ED evaluations and interventions may be delayed as a result of limited staffing during and the pandemic.*   Note:  This document was prepared using Dragon voice recognition software and may include unintentional dictation errors.    Laniyah Rosenwald, Layla Maw, DO 08/02/20 [phone]

## 2021-01-13 IMAGING — DX IR URETURAL STENT LEFT NEW ACCESS W/O SEP NEPHROSTOMY CATH
1 series · 1 of 1 positions shown · non-contrast
Comparison: CT of the abdomen and pelvis-04/27/2017

INDICATION: Renal stones, access for left percutaneous nephrolithotomy.

EXAM:
1. Percutaneous puncture of the renal collecting system under
fluoroscopic guidance
2. Placement of a percutaneous nephrostomy tube

[abdomen kub]
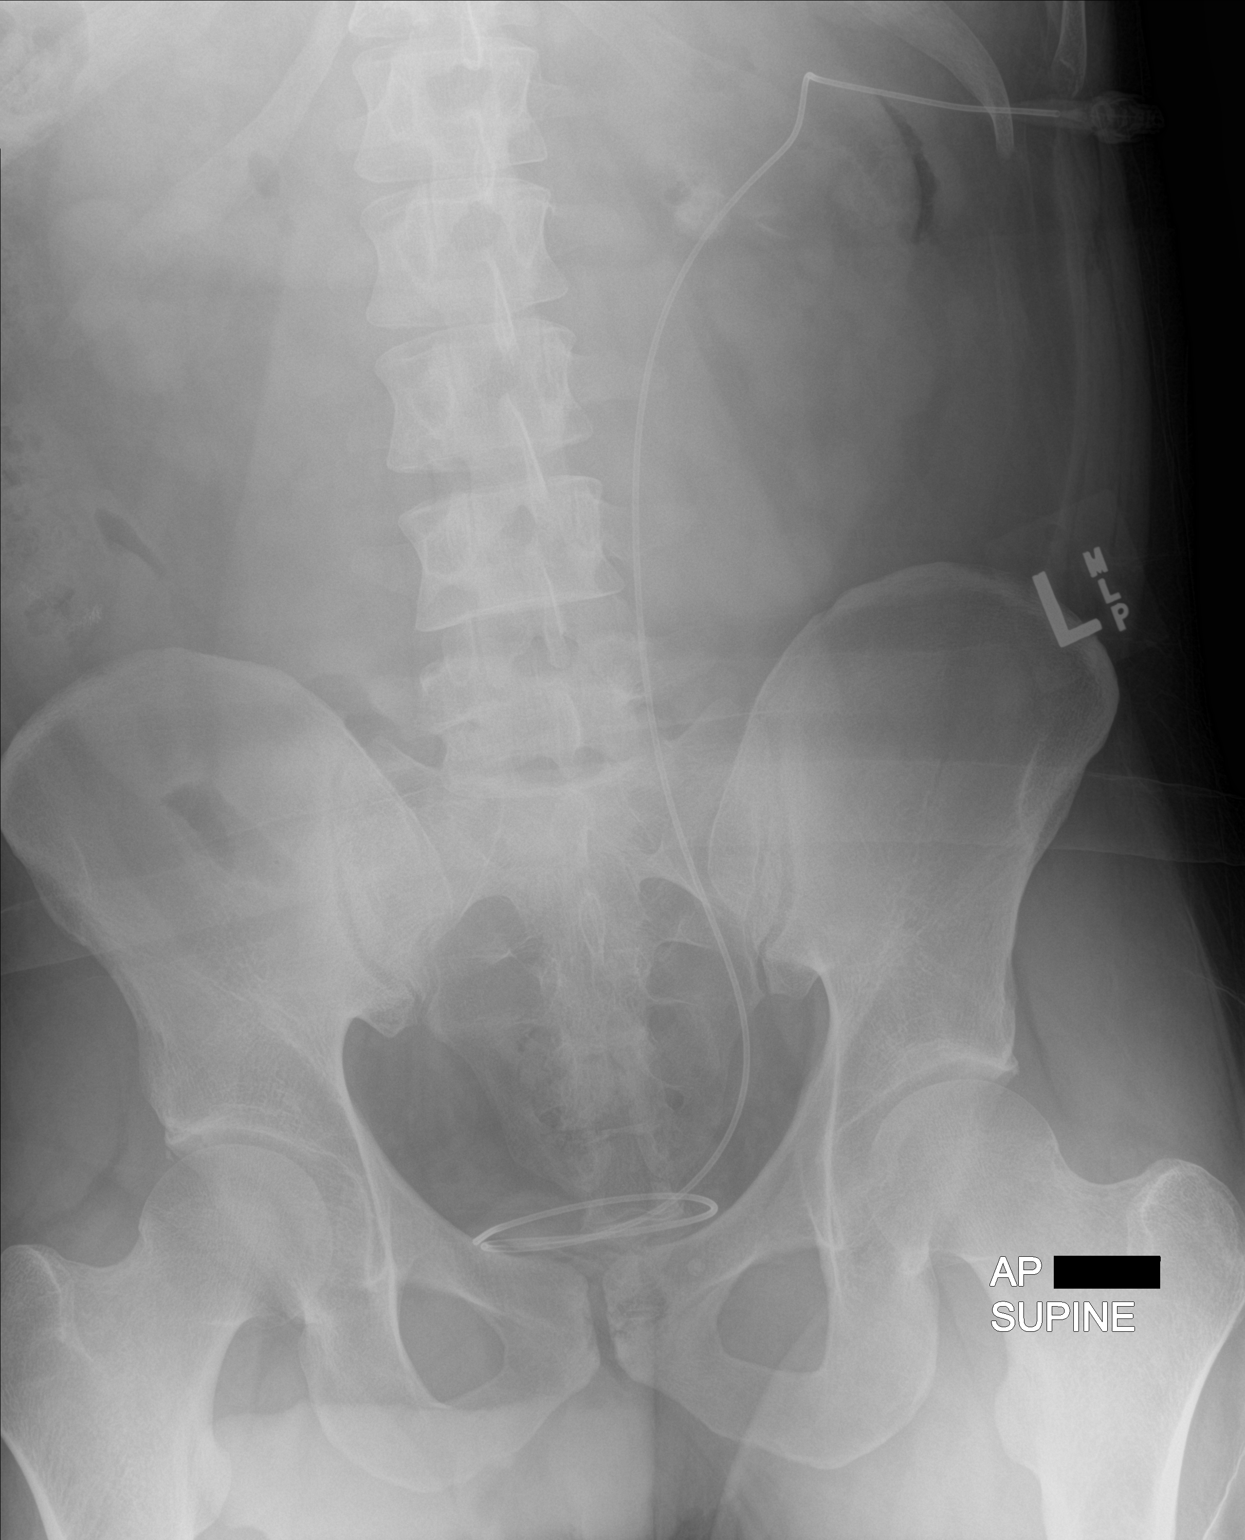

[1 of 1 positions shown; findings below may reference images not displayed]

MEDICATIONS:
400 mg Cipro; The antibiotic was administered in an appropriate time
frame prior to skin puncture.

ANESTHESIA/SEDATION:
Fentanyl 100 mcg IV; Versed 4 mg IV

Moderate Sedation Time:  38 minutes

The patient was continuously monitored during the procedure by the
interventional radiology nurse under my direct supervision.

CONTRAST:  20mL OMNIPAQUE IOHEXOL 300 MG/ML SOLN, 20mL OMNIPAQUE
IOHEXOL 300 MG/ML SOLN - administered into the collecting system(s)

FLUOROSCOPY TIME:  Fluoroscopy Time: 13 minutes 48 seconds (204
mGy).

COMPLICATIONS:
None immediate.

PROCEDURE:
Informed written consent was obtained from the patient after a
thorough discussion of the procedural risks, benefits and
alternatives. All questions were addressed. Maximal Sterile Barrier
Technique was utilized including caps, mask, sterile gowns, sterile
gloves, sterile drape, hand hygiene and skin antiseptic. A timeout
was performed prior to the initiation of the procedure.

A pre procedural spot fluoroscopic image was obtained of the upper
abdomen. Ultrasound scanning performed of the kidney was negative
for significant hydronephrosis. As such, the stone within the renal
pelvis was targeted fluoroscopically with a 22 gauge Chiba needle.
Access to the collecting system was confirmed with advancement of a
Nitrex wire into the collecting system. The needle was exchanged for
the inner 3 French catheter from an Accustick set and contrast
injection confirmed access. A small amount of air was injected into
the collecting system to help delineate a posterior calyx. A
posterior interpolar calyx was targeted with a 22 gauge Chiba
needle. Access to the calyx was confirmed with advancement of a
Nitrex wire into the collecting system. An Accustick set was
utilized to dilate the tract and was subsequently exchanged for a
Kumpe catheter over a Bentson wire. The Kumpe catheter was advanced
down the ureter and into the urinary bladder. Postprocedural spot
radiographs were obtained in various obliquities and the catheter
was sutured to the skin. The catheter was capped and a dressing was
placed. The patient tolerated the procedure well without immediate
postprocedural complication.
IMPRESSION: Successful fluoroscopic guided left percutaneous nephrostomy with
placement of a 5 French Kumpe catheter to the level of the urinary
bladder to be utilized during impending nephrolithotomy procedure.

## 2021-03-18 ENCOUNTER — Other Ambulatory Visit: Payer: Self-pay | Admitting: Family Medicine

## 2021-03-18 DIAGNOSIS — F411 Generalized anxiety disorder: Secondary | ICD-10-CM

## 2021-03-18 DIAGNOSIS — F331 Major depressive disorder, recurrent, moderate: Secondary | ICD-10-CM

## 2021-03-18 NOTE — Telephone Encounter (Signed)
Pt called in to request a refill for 2 medications. Pt was told by his pharmacy to contact his PCP office.    rosuvastatin (CRESTOR) 5 MG tablet venlafaxine XR (EFFEXOR XR) 150 MG 24 hr capsule    Pharmacy: [facility] [phone] [place] Rough, [facility] [address]  [address] [place] [address]  Phone:  [phone]  Fax:  [phone]    Past ov: 03/27/20  Future ov: none scheduled.

## 2021-03-19 MED ORDER — ROSUVASTATIN CALCIUM 5 MG PO TABS: 5.0000 mg | ORAL_TABLET | Freq: Every day | ORAL | 0 refills | Status: DC

## 2021-03-19 MED ORDER — VENLAFAXINE HCL ER 150 MG PO CP24: ORAL_CAPSULE | ORAL | 0 refills | Status: DC

## 2021-03-19 NOTE — Telephone Encounter (Signed)
Attempted to call patient to schedule appointment- left message to call office- 30 day courtesy RF given. Requested Prescriptions  Pending Prescriptions Disp Refills   rosuvastatin (CRESTOR) 5 MG tablet 30 tablet 0    Sig: Take 1 tablet (5 mg total) by mouth daily.     Cardiovascular:  Antilipid - Statins 2 Failed - 03/18/2021 11:32 AM      Failed - Lipid Panel in normal range within the last 12 months    Cholesterol  Date Value Ref Range Status  03/27/2020 321 (H) <200 mg/dL Final   LDL Cholesterol (Calc)  Date Value Ref Range Status  03/27/2020  mg/dL (calc) Final    Comment:    . LDL cholesterol not calculated. Triglyceride levels greater than 400 mg/dL invalidate calculated LDL results. . Reference range: <100 . Desirable range <100 mg/dL for primary prevention;   <70 mg/dL for patients with CHD or diabetic patients  with > or = 2 CHD risk factors. Marland Kitchen LDL-C is now calculated using the Martin-Hopkins  calculation, which is a validated novel method providing  better accuracy than the Friedewald equation in the  estimation of LDL-C.  [name] Lenox Ahr. 9563;875(64): 2061-2068  (http://education.[facility].com/faq/FAQ164)    HDL  Date Value Ref Range Status  03/27/2020 33 (L) > OR = 40 mg/dL Final   Triglycerides  Date Value Ref Range Status  03/27/2020 806 (H) <150 mg/dL Final    Comment:    . If a non-fasting specimen was collected, consider repeat triglyceride testing on a fasting specimen if clinically indicated.  [name] J. of Clin. Lipidol. 2015;9:129-169. . . There is increased risk of pancreatitis when the  triglyceride concentration is very high  (> or = 500 mg/dL, especially if > or = 3329 mg/dL).  [name] J. of Clin. Lipidol. 2015;9:129-169. Marland Kitchen          Passed - Cr in normal range and within 360 days    Creat  Date Value Ref Range Status  03/27/2020 0.82 0.60 - 1.35 mg/dL Final         Passed - Patient is not pregnant       Passed - Valid encounter within last 12 months    Recent Outpatient Visits          11 months ago Nephrolithiasis   [facility] [name], DO   1 year ago Seasonal allergic rhinitis due to pollen   [facility] [name], MD   1 year ago Moderate episode of recurrent major depressive disorder [facility])   [facility] [name], MD   1 year ago Generalized anxiety disorder   [facility] [name], MD   2 years ago Generalized anxiety disorder   [facility] [place], [name], FNP              venlafaxine XR (EFFEXOR XR) 150 MG 24 hr capsule 90 capsule 3    Sig: Take one tab daily     Psychiatry: Antidepressants - SNRI - desvenlafaxine & venlafaxine Failed - 03/18/2021 11:32 AM      Failed - Last BP in normal range    BP Readings from Last 1 Encounters:  08/02/20 (!) 146/97         Failed - Valid encounter within last 6 months    Recent Outpatient Visits          11 months ago Nephrolithiasis  [facility] [name], DO   1 year ago Seasonal allergic rhinitis due to pollen   [facility] [name], MD   1 year ago Moderate episode of recurrent major depressive disorder [facility])   [facility] Jamelle Haring, MD   1 year ago Generalized anxiety disorder   [facility] [name], MD   2 years ago Generalized anxiety disorder   [facility] [place], [place] Burton E, [place]             Failed - Lipid Panel in normal range within the last 12 months    Cholesterol  Date Value Ref Range Status  03/27/2020 321 (H) <200 mg/dL Final   LDL Cholesterol (Calc)  Date Value Ref Range Status  03/27/2020  mg/dL (calc) Final    Comment:    . LDL cholesterol not calculated. Triglyceride levels greater than 400 mg/dL invalidate  calculated LDL results. . Reference range: <100 . Desirable range <100 mg/dL for primary prevention;   <70 mg/dL for patients with CHD or diabetic patients  with > or = 2 CHD risk factors. Marland Kitchen LDL-C is now calculated using the Martin-Hopkins  calculation, which is a validated novel method providing  better accuracy than the Friedewald equation in the  estimation of LDL-C.  [name] Lenox Ahr. 5643;329(51): 2061-2068  (http://education.[facility].com/faq/FAQ164)    HDL  Date Value Ref Range Status  03/27/2020 33 (L) > OR = 40 mg/dL Final   Triglycerides  Date Value Ref Range Status  03/27/2020 806 (H) <150 mg/dL Final    Comment:    . If a non-fasting specimen was collected, consider repeat triglyceride testing on a fasting specimen if clinically indicated.  [name] J. of Clin. Lipidol. 2015;9:129-169. . . There is increased risk of pancreatitis when the  triglyceride concentration is very high  (> or = 500 mg/dL, especially if > or = 8841 mg/dL).  [name] J. of Clin. Lipidol. 2015;9:129-169. Marland Kitchen          Passed - Cr in normal range and within 360 days    Creat  Date Value Ref Range Status  03/27/2020 0.82 0.60 - 1.35 mg/dL Final         Passed - Completed PHQ-2 or PHQ-9 in the last 360 days

## 2021-03-19 NOTE — Telephone Encounter (Signed)
Requested medication (s) are due for refill today - yes  Requested medication (s) are on the active medication list -yes  Future visit scheduled -no  Last refill: 03/27/20 #90 3RF  Notes to clinic: Request RF: Attempted to call patient to schedule appointment- left message to call office. Fails office visit protocol- 11 months ( Rx was written for 1 year)  Requested Prescriptions  Pending Prescriptions Disp Refills   venlafaxine XR (EFFEXOR XR) 150 MG 24 hr capsule 90 capsule 3    Sig: Take one tab daily     Psychiatry: Antidepressants - SNRI - desvenlafaxine & venlafaxine Failed - 03/18/2021 11:32 AM      Failed - Last BP in normal range    BP Readings from Last 1 Encounters:  08/02/20 (!) 146/97          Failed - Valid encounter within last 6 months    Recent Outpatient Visits           11 months ago [facility]rol[facility]   [facility]   1 year ago Seasonal allergic rhinitis due to Palmer   [facility]   1 year ago Moderate episode of recurrent major depressive disorder [facility]   [facility]   1 year ago Generalized anxiety disorder   [facility]   2 years ago Generalized anxiety disorder   [facility]              Failed - Lipid Panel in normal range within the last 12 months    Cholesterol  Date Value Ref Range Status  03/27/2020 321 (H) <200 mg/dL Final   LDL Cholesterol (Calc)  Date Value Ref Range Status  03/27/2020  mg/dL (calc) Final    Comment:    . LDL cholesterol not calculated. Triglyceride levels greater than 400 mg/dL invalidate calculated LDL results. . Reference range: <100 . Desirable range <100 mg/dL for primary prevention;   <70 mg/dL for patients with CHD or diabetic patients  with > or = 2 CHD risk factors. Marland Kitchen. LDL-C is now  calculated using the Adam-Hopkins  calculation, which is a validated novel method providing  better accuracy than the Friedewald equation in the  estimation of LDL-C.  Adam Palmer et al. Lenox AhrJAMA. 1610;960(452013;310(19): 2061-2068  (http://education.[facility].com/faq/FAQ164)    HDL  Date Value Ref Range Status  03/27/2020 33 (L) > OR = 40 mg/dL Final   Triglycerides  Date Value Ref Range Status  03/27/2020 806 (H) <150 mg/dL Final    Comment:    . If a non-fasting specimen was collected, consider repeat triglyceride testing on a fasting specimen if clinically indicated.  Adam Palmer et al. J. of Clin. Lipidol. 2015;9:129-169. . . There is increased risk of pancreatitis when the  triglyceride concentration is very high  (> or = 500 mg/dL, especially if > or = 40981000 mg/dL).  Adam Palmer et al. J. of Clin. Lipidol. 2015;9:129-169. Marland Kitchen.          Passed - Cr in normal range and within 360 days    Creat  Date Value Ref Range Status  03/27/2020 0.82 0.60 - 1.35 mg/dL Final          Passed - Completed PHQ-2 or PHQ-9 in the last 360 days      Signed Prescriptions Disp Refills   rosuvastatin (CRESTOR) 5 MG tablet 30 tablet 0  Sig: Take 1 tablet (5 mg total) by mouth daily.     Cardiovascular:  Antilipid - Statins 2 Failed - 03/18/2021 11:32 AM      Failed - Lipid Panel in normal range within the last 12 months    Cholesterol  Date Value Ref Range Status  03/27/2020 321 (H) <200 mg/dL Final   LDL Cholesterol (Calc)  Date Value Ref Range Status  03/27/2020  mg/dL (calc) Final    Comment:    . LDL cholesterol not calculated. Triglyceride levels greater than 400 mg/dL invalidate calculated LDL results. . Reference range: <100 . Desirable range <100 mg/dL for primary prevention;   <70 mg/dL for patients with CHD or diabetic patients  with > or = 2 CHD risk factors. Marland Kitchen LDL-C is now calculated using the Adam-Hopkins  calculation, which is a validated novel method providing   better accuracy than the Friedewald equation in the  estimation of LDL-C.  Adam Palmer et al. Lenox Ahr. 5027;741(28): 2061-2068  (http://education.[facility].com/faq/FAQ164)    HDL  Date Value Ref Range Status  03/27/2020 33 (L) > OR = 40 mg/dL Final   Triglycerides  Date Value Ref Range Status  03/27/2020 806 (H) <150 mg/dL Final    Comment:    . If a non-fasting specimen was collected, consider repeat triglyceride testing on a fasting specimen if clinically indicated.  Adam Palmer et al. J. of Clin. Lipidol. 2015;9:129-169. . . There is increased risk of pancreatitis when the  triglyceride concentration is very high  (> or = 500 mg/dL, especially if > or = 7867 mg/dL).  Adam Palmer et al. J. of Clin. Lipidol. 2015;9:129-169. Marland Kitchen          Passed - Cr in normal range and within 360 days    Creat  Date Value Ref Range Status  03/27/2020 0.82 0.60 - 1.35 mg/dL Final          Passed - Patient is not pregnant      Passed - Valid encounter within last 12 months    Recent Outpatient Visits           11 months ago Nephrolithiasis   Adam Palmer   1 year ago Seasonal allergic rhinitis due to Palmer   [facility] Palmer   1 year ago Moderate episode of recurrent major depressive disorder [facility])   [facility] Adam Palmer   1 year ago Generalized anxiety disorder   [facility] Adam Palmer   2 years ago Generalized anxiety disorder   [facility] Palmer                 Requested Prescriptions  Pending Prescriptions Disp Refills   venlafaxine XR (EFFEXOR XR) 150 MG 24 hr capsule 90 capsule 3    Sig: Take one tab daily     Psychiatry: Antidepressants - SNRI - desvenlafaxine & venlafaxine Failed - 03/18/2021 11:32 AM      Failed - Last BP in normal range    BP Readings from Last 1 Encounters:   08/02/20 (!) 146/97          Failed - Valid encounter within last 6 months    Recent Outpatient Visits           11 months ago Nephrolithiasis   [facility] [place], Palmer   1 year ago Seasonal allergic rhinitis due to Palmer  [facility] Adam Palmer   1 year ago Moderate episode of recurrent major depressive disorder [facility])   [facility] Adam [place]aring, Palmer   1 year ago Generalized anxiety disorder   [facility] Adam Palmer   2 years ago Generalized anxiety disorder   [facility] [place] [place] [facility], Palmer              Failed - Lipid Panel in normal range within the last 12 months    Cholesterol  Date Value Ref Range Status  03/27/2020 321 (H) <200 mg/dL Final   LDL Cholesterol (Calc)  Date Value Ref Range Status  03/27/2020  mg/dL (calc) Final    Comment:    . LDL cholesterol not calculated. Triglyceride levels greater than 400 mg/dL invalidate calculated LDL results. . Reference range: <100 . Desirable range <100 mg/dL for primary prevention;   <70 mg/dL for patients with CHD or diabetic patients  with > or = 2 CHD risk factors. Marland Kitchen LDL-C is now calculated using the Adam-Hopkins  calculation, which is a validated novel method providing  better accuracy than the Friedewald equation in the  estimation of LDL-C.  Adam Palmer et al. Lenox Ahr. 0802;233(61): 2061-2068  (http://education.[facility].com/faq/FAQ164)    HDL  Date Value Ref Range Status  03/27/2020 33 (L) > OR = 40 mg/dL Final   Triglycerides  Date Value Ref Range Status  03/27/2020 806 (H) <150 mg/dL Final    Comment:    . If a non-fasting specimen was collected, consider repeat triglyceride testing on a fasting specimen if clinically indicated.  Adam Palmer et al. J. of Clin. Lipidol. 2015;9:129-169. . . There is increased risk of pancreatitis when the   triglyceride concentration is very high  (> or = 500 mg/dL, especially if > or = 2244 mg/dL).  Adam Palmer et al. J. of Clin. Lipidol. 2015;9:129-169. Marland Kitchen          Passed - Cr in normal range and within 360 days    Creat  Date Value Ref Range Status  03/27/2020 0.82 0.60 - 1.35 mg/dL Final          Passed - Completed PHQ-2 or PHQ-9 in the last 360 days      Signed Prescriptions Disp Refills   rosuvastatin (CRESTOR) 5 MG tablet 30 tablet 0    Sig: Take 1 tablet (5 mg total) by mouth daily.     Cardiovascular:  Antilipid - Statins 2 Failed - 03/18/2021 11:32 AM      Failed - Lipid Panel in normal range within the last 12 months    Cholesterol  Date Value Ref Range Status  03/27/2020 321 (H) <200 mg/dL Final   LDL Cholesterol (Calc)  Date Value Ref Range Status  03/27/2020  mg/dL (calc) Final    Comment:    . LDL cholesterol not calculated. Triglyceride levels greater than 400 mg/dL invalidate calculated LDL results. . Reference range: <100 . Desirable range <100 mg/dL for primary prevention;   <70 mg/dL for patients with CHD or diabetic patients  with > or = 2 CHD risk factors. Marland Kitchen LDL-C is now calculated using the Adam-Hopkins  calculation, which is a validated novel method providing  better accuracy than the Friedewald equation in the  estimation of LDL-C.  Adam Palmer et al. Lenox Ahr. 9753;005(11): 2061-2068  (http://education.[facility].com/faq/FAQ164)    HDL  Date Value Ref Range Status  03/27/2020 33 (L) > OR = 40 mg/dL Final  Triglycerides  Date Value Ref Range Status  03/27/2020 806 (H) <150 mg/dL Final    Comment:    . If a non-fasting specimen was collected, consider repeat triglyceride testing on a fasting specimen if clinically indicated.  Adam Palmer et al. J. of Clin. Lipidol. 2015;9:129-169. . . There is increased risk of pancreatitis when the  triglyceride concentration is very high  (> or = 500 mg/dL, especially if > or = 3474 mg/dL).   Adam Palmer et al. J. of Clin. Lipidol. 2015;9:129-169. Marland Kitchen          Passed - Cr in normal range and within 360 days    Creat  Date Value Ref Range Status  03/27/2020 0.82 0.60 - 1.35 mg/dL Final          Passed - Patient is not pregnant      Passed - Valid encounter within last 12 months    Recent Outpatient Visits           11 months ago Nephrolithiasis   [name], Palmer   1 year ago Seasonal allergic rhinitis due to Palmer   [facility] Adam Palmer   1 year ago Moderate episode of recurrent major depressive disorder [facility])   [facility] Adam Haring, Palmer   1 year ago Generalized anxiety disorder   [facility] Adam Palmer   2 years ago Generalized anxiety disorder   [facility] [name], Palmer

## 2021-03-21 ENCOUNTER — Encounter: Payer: Self-pay | Admitting: Internal Medicine

## 2021-03-21 ENCOUNTER — Ambulatory Visit (INDEPENDENT_AMBULATORY_CARE_PROVIDER_SITE_OTHER): Payer: BC Managed Care – PPO | Admitting: Internal Medicine

## 2021-03-21 VITALS — BP 118/82 | HR 97 | Temp 98.3°F | Resp 16 | Ht 70.0 in | Wt 201.5 lb

## 2021-03-21 DIAGNOSIS — F411 Generalized anxiety disorder: Secondary | ICD-10-CM | POA: Diagnosis not present

## 2021-03-21 DIAGNOSIS — F331 Major depressive disorder, recurrent, moderate: Secondary | ICD-10-CM | POA: Diagnosis not present

## 2021-03-21 DIAGNOSIS — E781 Pure hyperglyceridemia: Secondary | ICD-10-CM

## 2021-03-21 LAB — COMPLETE METABOLIC PANEL WITH GFR
AG Ratio: 1.5 (calc) (ref 1.0–2.5)
ALT: 57 U/L — ABNORMAL HIGH (ref 9–46)
AST: 43 U/L — ABNORMAL HIGH (ref 10–40)
Albumin: 4.7 g/dL (ref 3.6–5.1)
Alkaline phosphatase (APISO): 102 U/L (ref 36–130)
BUN: 9 mg/dL (ref 7–25)
CO2: 27 mmol/L (ref 20–32)
Calcium: 9.9 mg/dL (ref 8.6–10.3)
Chloride: 103 mmol/L (ref 98–110)
Creat: 0.93 mg/dL (ref 0.60–1.26)
Globulin: 3.1 g/dL (calc) (ref 1.9–3.7)
Glucose, Bld: 86 mg/dL (ref 65–99)
Potassium: 4.2 mmol/L (ref 3.5–5.3)
Sodium: 138 mmol/L (ref 135–146)
Total Bilirubin: 0.4 mg/dL (ref 0.2–1.2)
Total Protein: 7.8 g/dL (ref 6.1–8.1)
eGFR: 112 mL/min/{1.73_m2} (ref >=60)

## 2021-03-21 LAB — CBC WITH DIFFERENTIAL/PLATELET
Absolute Monocytes: 962 cells/uL — ABNORMAL HIGH (ref 200–950)
Basophils Absolute: 59 cells/uL (ref 0–200)
Basophils Relative: 0.8 %
Eosinophils Absolute: 289 cells/uL (ref 15–500)
Eosinophils Relative: 3.9 %
HCT: 44.9 % (ref 38.5–50.0)
Hemoglobin: 15.1 g/dL (ref 13.2–17.1)
Lymphs Abs: 2560 cells/uL (ref 850–3900)
MCH: 29.1 pg (ref 27.0–33.0)
MCHC: 33.6 g/dL (ref 32.0–36.0)
MCV: 86.5 fL (ref 80.0–100.0)
MPV: 11.1 fL (ref 7.5–12.5)
Monocytes Relative: 13 %
Neutro Abs: 3530 cells/uL (ref 1500–7800)
Neutrophils Relative %: 47.7 %
Platelets: 267 10*3/uL (ref 140–400)
RBC: 5.19 10*6/uL (ref 4.20–5.80)
RDW: 13.5 % (ref 11.0–15.0)
Total Lymphocyte: 34.6 %
WBC: 7.4 10*3/uL (ref 3.8–10.8)

## 2021-03-21 LAB — LIPID PANEL
Cholesterol: 181 mg/dL (ref <200)
HDL: 30 mg/dL — ABNORMAL LOW (ref >=40)
Non-HDL Cholesterol (Calc): 151 mg/dL (calc) — ABNORMAL HIGH (ref <130)
Total CHOL/HDL Ratio: 6 (calc) — ABNORMAL HIGH (ref <5.0)
Triglycerides: 551 mg/dL — ABNORMAL HIGH (ref <150)

## 2021-03-21 MED ORDER — VENLAFAXINE HCL ER 150 MG PO CP24: ORAL_CAPSULE | ORAL | 3 refills | Status: DC

## 2021-03-21 NOTE — Patient Instructions (Addendum)
It was great seeing you today!  Plan discussed at today's visit: -Blood work ordered today, results will be uploaded to [place].  -Medication sent to pharmacy, will updates cholesterol medication after reviewing lab results  Follow up in: 1 year   Take care and let us know if you have any questions or concerns prior to your next visit.  Dr. Rosana Berger

## 2021-03-21 NOTE — Assessment & Plan Note (Signed)
Reviewed last lipid panel with patient, on Crestor 5 mg. Recheck labs today, will most likely have to increase statin and consider Vascepa. Discussed dietary changes as well.

## 2021-03-21 NOTE — Assessment & Plan Note (Signed)
Stable, continue Effexor

## 2021-03-21 NOTE — Progress Notes (Signed)
Acute Office Visit  Subjective:    Patient ID: Adam Palmer, male    DOB: 04-16-1990, 31 y.o.   MRN: [id number]  Chief Complaint  Patient presents with   Follow-up   Anxiety    HPI Patient is in today for anxiety.  Anxiety: -Duration:stable -Anxious mood: yes   Depression screen [facility] 2/9 03/21/2021 03/27/2020 06/22/2019 04/25/2019 03/29/2019  Decreased Interest 0 0 0 3 2  Down, Depressed, Hopeless 1 0 0 2 1  PHQ - 2 Score 1 0 0 5 3  Altered sleeping 0 1 1 3  0  Tired, decreased energy 1 1 1 3 3   Change in appetite 0 0 0 2 0  Feeling bad or failure about yourself  0 0 0 2 0  Trouble concentrating 0 0 0 1 0  Moving slowly or fidgety/restless 0 0 0 2 0  Suicidal thoughts 0 0 0 0 0  PHQ-9 Score 2 2 2 18 6   Difficult doing work/chores Not difficult at all Not difficult at all Not difficult at all Very difficult Not difficult at all  Some recent data might be hidden   -Current Treatments: Effexor 150mg  daily, Xanax 0.25mg  prn  -Patient is compliant with the above medications at above dose and reports no side effects.  -Past Treatments: Trintellix, Effexor, Xanax, Buspar, Lexapro -Counseling: No   HLD: -Medications: Crestor 5 -Patient is compliant with above medications and reports no side effects.  -Last lipid panel: Lipid Panel     Component Value Date/Time   CHOL 321 (H) 03/27/2020 0836   TRIG 806 (H) 03/27/2020 0836   HDL 33 (L) 03/27/2020 0836   CHOLHDL 9.7 (H) 03/27/2020 0836   LDLCALC  03/27/2020 0836     Comment:     . LDL cholesterol not calculated. Triglyceride levels greater than 400 mg/dL invalidate calculated LDL results. . Reference range: <100 . Desirable range <100 mg/dL for primary prevention;   <70 mg/dL for patients with CHD or diabetic patients  with > or = 2 CHD risk factors. 05/27/2020 LDL-C is now calculated using the Martin-Hopkins  calculation, which is a validated novel method providing  better accuracy than the Friedewald equation in the   estimation of LDL-C.  05/27/2020 et al. 05/27/2020. 05/27/2020): 2061-2068  (http://education.[facility].com/faq/FAQ164)      Past Medical History:  Diagnosis Date   Anxiety    Depression    History of kidney stones    Kidney calculi     Past Surgical History:  Procedure Laterality Date   APPENDECTOMY     IR URETERAL STENT LEFT NEW ACCESS W/O SEP NEPHROSTOMY CATH  01/31/2019   NEPHROLITHOTOMY Left 01/31/2019   Procedure: NEPHROLITHOTOMY PERCUTANEOUS/ INTERVENTIONAL RADIOLOGY TO PLACE NEPHROSTOMY TUBE PRIOR;  Surgeon: [id number];329(92, MD;  Location: [facility];  Service: Urology;  Laterality: Left;    Family History  Problem Relation Age of Onset   Anxiety disorder Mother    Anxiety disorder Father    Kidney disease Neg Hx    Prostate cancer Neg Hx    Bladder Cancer Neg Hx    Kidney cancer Neg Hx     Social History   Socioeconomic History   Marital status: Married    Spouse name: 10-06-1970   Number of children: 3   Years of education: Not on file   Highest education level: Not on file  Occupational History   Not on file  Tobacco Use   Smoking status: Former    Packs/day: 0.50  Years: 4.00    Pack years: 2.00    Types: Cigarettes   Smokeless tobacco: Current    Types: Chew   Tobacco comments:    quit 9 years  Vaping Use   Vaping Use: Never used  Substance and Sexual Activity   Alcohol use: Yes    Comment: rare   Drug use: No   Sexual activity: Yes    Partners: Female  Other Topics Concern   Not on file  Social History Narrative   Married, Wife is Baird Lyons, has a 27 (son), 6 (son), and 3yo (daughter)   Social Determinants of Health   Financial Resource Strain: Not on file  Food Insecurity: Not on file  Transportation Needs: Not on file  Physical Activity: Not on file  Stress: Not on file  Social Connections: Not on file  Intimate Partner Violence: Not on file    Outpatient Medications Prior to Visit  Medication Sig Dispense Refill   albuterol (VENTOLIN  HFA) 108 (90 Base) MCG/ACT inhaler Inhale 2 puffs into the lungs every 6 (six) hours as needed for wheezing or shortness of breath. 6.7 g 3   ALPRAZolam (XANAX) 0.25 MG tablet Take 1 tablet (0.25 mg total) by mouth 2 (two) times daily as needed for anxiety. To last 1 year. 15 tablet 0   fluticasone (FLONASE) 50 MCG/ACT nasal spray Place 2 sprays into both nostrils daily. 16 g 5   levocetirizine (XYZAL) 5 MG tablet Take 1 tablet (5 mg total) by mouth daily as needed for allergies. 90 tablet 0   methocarbamol (ROBAXIN) 500 MG tablet Take 1 tablet (500 mg total) by mouth every 8 (eight) hours as needed for muscle spasms. 15 tablet 0   predniSONE (STERAPRED UNI-PAK 21 TAB) 10 MG (21) TBPK tablet Take as directed 21 tablet 0   rosuvastatin (CRESTOR) 5 MG tablet Take 1 tablet (5 mg total) by mouth daily. 30 tablet 0   venlafaxine XR (EFFEXOR XR) 150 MG 24 hr capsule Take one tab daily 30 capsule 0   No facility-administered medications prior to visit.    No Known Allergies  Review of Systems  Constitutional: Negative.   Respiratory: Negative.    Cardiovascular: Negative.   Gastrointestinal: Negative.   Psychiatric/Behavioral:  The patient is nervous/anxious.       Objective:    Physical Exam Constitutional:      Appearance: Normal appearance.  HENT:     Head: Normocephalic and atraumatic.  Eyes:     Conjunctiva/sclera: Conjunctivae normal.  Cardiovascular:     Rate and Rhythm: Normal rate and regular rhythm.  Pulmonary:     Effort: Pulmonary effort is normal.     Breath sounds: Normal breath sounds.  Musculoskeletal:     Right lower leg: No edema.     Left lower leg: No edema.  Skin:    General: Skin is warm and dry.  Neurological:     General: No focal deficit present.     Mental Status: He is alert. Mental status is at baseline.  Psychiatric:        Mood and Affect: Mood normal.        Behavior: Behavior normal.    BP 118/82    Pulse 97    Temp 98.3 F (36.8 C)    Resp  16    Ht 5\' 10"  (1.778 m)    Wt 201 lb 8 oz (91.4 kg)    SpO2 99%    BMI 28.91 kg/m  Wt Readings  from Last 3 Encounters:  08/02/20 190 lb (86.2 kg)  03/27/20 194 lb 4.8 oz (88.1 kg)  06/22/19 180 lb (81.6 kg)    Health Maintenance Due  Topic Date Due   COVID-19 Vaccine (2 - Booster for Janssen series) 07/23/2019   INFLUENZA VACCINE  08/27/2020    There are no preventive care reminders to display for this patient.   No results found for: TSH Lab Results  Component Value Date   WBC 16.1 (H) 02/03/2019   HGB 16.3 02/03/2019   HCT 48.0 02/03/2019   MCV 90.8 02/03/2019   PLT 295 02/03/2019   Lab Results  Component Value Date   NA 136 03/27/2020   K 3.8 03/27/2020   CO2 29 03/27/2020   GLUCOSE 78 03/27/2020   BUN 12 03/27/2020   CREATININE 0.82 03/27/2020   BILITOT 0.6 11/24/2018   ALKPHOS 101 08/28/2013   AST 23 11/24/2018   ALT 28 11/24/2018   PROT 8.3 (H) 11/24/2018   ALBUMIN 4.4 08/28/2013   CALCIUM 10.3 03/27/2020   ANIONGAP 10 02/03/2019   Lab Results  Component Value Date   CHOL 321 (H) 03/27/2020   Lab Results  Component Value Date   HDL 33 (L) 03/27/2020   Lab Results  Component Value Date   [facility]  03/27/2020     Comment:     . LDL cholesterol not calculated. Triglyceride levels greater than 400 mg/dL invalidate calculated LDL results. . Reference range: <100 . Desirable range <100 mg/dL for primary prevention;   <70 mg/dL for patients with CHD or diabetic patients  with > or = 2 CHD risk factors. Marland Kitchen LDL-C is now calculated using the Martin-Hopkins  calculation, which is a validated novel method providing  better accuracy than the Friedewald equation in the  estimation of LDL-C.  [name] Lenox Ahr. 4034;742(59): 2061-2068  (http://education.[facility].com/faq/FAQ164)    Lab Results  Component Value Date   TRIG 806 (H) 03/27/2020   Lab Results  Component Value Date   CHOLHDL 9.7 (H) 03/27/2020   No results found for:  HGBA1C     Assessment & Plan:   Problem List Items Addressed This Visit       Other   Pure hypertriglyceridemia - Primary    Reviewed last lipid panel with patient, on Crestor 5 mg. Recheck labs today, will most likely have to increase statin and consider Vascepa. Discussed dietary changes as well.       Relevant Orders   CBC w/Diff/Platelet   COMPLETE METABOLIC PANEL WITH GFR   Lipid Profile   Generalized anxiety disorder    Stable, refills today. Discussed trying to avoid having to take short acting anxiety-lytics, not interested in counseling.       Relevant Medications   venlafaxine XR (EFFEXOR XR) 150 MG 24 hr capsule   Other Relevant Orders   CBC w/Diff/Platelet   COMPLETE METABOLIC PANEL WITH GFR   Moderate episode of recurrent major depressive disorder (HCC)    Stable, continue Effexor.      Relevant Medications   venlafaxine XR (EFFEXOR XR) 150 MG 24 hr capsule     Meds ordered this encounter  Medications   venlafaxine XR (EFFEXOR XR) 150 MG 24 hr capsule    Sig: Take one tab daily    Dispense:  90 capsule    Refill:  3    For next refill, change to 150mg  tablet, one daily as ordered.     Margarita Mail, DO

## 2021-03-21 NOTE — Assessment & Plan Note (Signed)
Stable, refills today. Discussed trying to avoid having to take short acting anxiety-lytics, not interested in counseling.

## 2021-03-22 MED ORDER — ICOSAPENT ETHYL 1 G PO CAPS: 2.0000 g | ORAL_CAPSULE | Freq: Two times a day (BID) | ORAL | 3 refills | Status: DC

## 2021-03-22 NOTE — Addendum Note (Signed)
Addended by: Teodora Medici on: 03/22/2021 09:19 AM   Modules accepted: Orders

## 2021-10-07 ENCOUNTER — Ambulatory Visit: Payer: BC Managed Care – PPO | Admitting: Internal Medicine

## 2021-10-07 ENCOUNTER — Encounter: Payer: Self-pay | Admitting: Internal Medicine

## 2021-10-07 VITALS — BP 118/88 | HR 94 | Temp 98.1°F | Resp 16 | Ht 70.0 in | Wt 208.1 lb

## 2021-10-07 DIAGNOSIS — G5602 Carpal tunnel syndrome, left upper limb: Secondary | ICD-10-CM | POA: Diagnosis not present

## 2021-10-07 NOTE — Progress Notes (Signed)
   Acute Office Visit  Subjective:     Patient ID: Adam Palmer, male    DOB: [birth year], 31 y.o.   MRN: [id number]  Chief Complaint  Patient presents with   Wrist Pain    Left x4 days    HPI Patient is in today for pain in wrist.   WRIST PAIN  Duration:  4 days Involved wrist: left Mechanism of injury:  unknown Location: medial and dorsal, radiates into second and third digits  Onset: gradual Severity: moderate  Quality:  aching Frequency: intermittent Radiation: yes Aggravating factors: nothing and movement  Alleviating factors: APAP, NSAIDs, and brace  Status: stable Treatments attempted: rest, APAP, and ibuprofen    Relief with NSAIDs?:  moderate Weakness: yes Numbness: yes  Redness: no Bruising: no Swelling: yes Fevers: no  Review of Systems  Constitutional:  Negative for chills and fever.  Musculoskeletal:  Positive for joint pain. Negative for falls.  Neurological:  Positive for tingling and weakness.      Objective:    BP 118/88   Pulse 94   Temp 98.1 F (36.7 C)   Resp 16   Ht 5\' 10"  (1.778 m)   Wt 208 lb 1.6 oz (94.4 kg)   SpO2 98%   BMI 29.86 kg/m  BP Readings from Last 3 Encounters:  10/07/21 118/88  03/21/21 118/82  08/02/20 (!) 146/97   Wt Readings from Last 3 Encounters:  10/07/21 208 lb 1.6 oz (94.4 kg)  03/21/21 201 lb 8 oz (91.4 kg)  08/02/20 190 lb (86.2 kg)      Physical Exam Constitutional:      Appearance: Normal appearance.  HENT:     Head: Normocephalic and atraumatic.  Cardiovascular:     Rate and Rhythm: Normal rate and regular rhythm.  Pulmonary:     Effort: Pulmonary effort is normal.     Breath sounds: Normal breath sounds.  Musculoskeletal:        General: Tenderness present. No swelling.     Comments: Good range of motion but pain with supination and wrist flexion. Phalen's sign positive. Weak thumb abduction. No thenar eminence atrophy.   Skin:    General: Skin is warm and dry.  Neurological:      General: No focal deficit present.     Mental Status: He is alert. Mental status is at baseline.     Motor: Weakness present.  Psychiatric:        Mood and Affect: Mood normal.        Behavior: Behavior normal.     No results found for any visits on 10/07/21.      Assessment & Plan:   1. Carpal tunnel syndrome of left wrist: Symptoms and physical exam consistent with carpal tunnel. Discussed night brace, carpal tunnel stretches and massage. Can continue Ibuprofen and Tylenol as needed. If symptoms worse or fail to improve will follow up for surgery referral.    Return for already scheduled .  12/07/21, DO

## 2021-10-07 NOTE — Patient Instructions (Addendum)
It was great seeing you today!  Plan discussed at today's visit: -Recommend night splint, carpal tunnel massage and stretching as well as Tylenol and Ibuprofen as needed -Let me know if this is  no longer helping or symptoms get worse and we can have you see orthopedics   Follow up in: in February or sooner as needed  Take care and let us know if you have any questions or concerns prior to your next visit.  Dr. Caralee Ates  Carpal Tunnel Syndrome  Carpal tunnel syndrome is a condition that causes pain, weakness, and numbness in your hand and arm. Numbness is when you cannot feel an area in your body. The carpal tunnel is a narrow area that is on the palm side of your wrist. Repeated wrist motion or certain diseases may cause swelling in the tunnel. This swelling can pinch the main nerve in the wrist. This nerve is called the median nerve. What are the causes? This condition may be caused by: Moving your hand and wrist over and over again while doing a task. Injury to the wrist. Arthritis. A sac of fluid (cyst) or abnormal growth (tumor) in the carpal tunnel. Fluid buildup during pregnancy. Use of tools that vibrate. Sometimes the cause is not known. What increases the risk? The following factors may make you more likely to have this condition: Having a job that makes you do these things: Move your hand over and over again. Work with tools that vibrate, such as drills or sanders. Being a woman. Having diabetes, obesity, thyroid problems, or kidney failure. What are the signs or symptoms? Symptoms of this condition include: A tingling feeling in your fingers. Tingling or loss of feeling in your hand. Pain in your entire arm. This pain may get worse when you bend your wrist and elbow for a long time. Pain in your wrist that goes up your arm to your shoulder. Pain that goes down into your palm or fingers. Weakness in your hands. You may find it hard to grab and hold items. You may feel  worse at night. How is this treated? This condition may be treated with: Lifestyle changes. You will be asked to stop or change the activity that caused your problem. Doing exercises and activities that make bones, muscles, and tendons stronger (physical therapy). Learning how to use your hand again (occupational therapy). Medicines for pain and swelling. You may have injections in your wrist. A wrist splint or brace. Surgery. Follow these instructions at home: If you have a splint or brace: Wear the splint or brace as told by your doctor. Take it off only as told by your doctor. Loosen the splint if your fingers: Tingle. Become numb. Turn cold and blue. Keep the splint or brace clean. If the splint or brace is not waterproof: Do not let it get wet. Cover it with a watertight covering when you take a bath or a shower. Managing pain, stiffness, and swelling If told, put ice on the painful area: If you have a removable splint or brace, remove it as told by your doctor. Put ice in a plastic bag. Place a towel between your skin and the bag. Leave the ice on for 20 minutes, 2-3 times per day. Do not fall asleep with the cold pack on your skin. Take off the ice if your skin turns bright red. This is very important. If you cannot feel pain, heat, or cold, you have a greater risk of damage to the area. Move your  fingers often to reduce stiffness and swelling. General instructions Take over-the-counter and prescription medicines only as told by your doctor. Rest your wrist from any activity that may cause pain. If needed, talk with your boss at work about changes that can help your wrist heal. Do exercises as told by your doctor, physical therapist, or occupational therapist. Keep all follow-up visits. Contact a doctor if: You have new symptoms. Medicine does not help your pain. Your symptoms get worse. Get help right away if: You have very bad numbness or tingling in your wrist or  hand. Summary Carpal tunnel syndrome is a condition that causes pain in your hand and arm. It is often caused by repeated wrist motions. Lifestyle changes and medicines are used to treat this problem. Surgery may help in very bad cases. Follow your doctor's instructions about wearing a splint, resting your wrist, keeping follow-up visits, and calling for help. This information is not intended to replace advice given to you by your health care provider. Make sure you discuss any questions you have with your health care provider. Document Revised: 05/26/2019 Document Reviewed: 04/29/2  Nonsurgical Treatment for Carpal Tunnel Syndrome, Adult Patient will learn several ways to treat Carpal Tunnel in its early stages: splints, edema glove, re-posturing. To view the content, go to this web address: https://pe.elsevier.com/6ssi8qp  This video will expire on: 07/20/2023. If you need access to this video following this date, please reach out to the healthcare provider who assigned it to you. This information is not intended to replace advice given to you by your health care provider. Make sure you discuss any questions you have with your health care provider. Elsevier Patient Education  2023 [employer].

## 2022-03-20 NOTE — Progress Notes (Signed)
Acute Office Visit  Subjective:    Patient ID: Adam Palmer, male    DOB: 1990-08-24, 32 y.o.   MRN: [id number]  Chief Complaint  Patient presents with   Follow-up    HPI Patient is in today for follow up on chronic medical conditions.   Anxiety: -Duration:stable -Anxious mood: yes      03/21/2022   11:13 AM 10/07/2021    3:16 PM 03/21/2021   11:22 AM 03/27/2020    8:18 AM 06/22/2019    7:59 AM  Depression screen PHQ 2/9  Decreased Interest 0 0 0 0 0  Down, Depressed, Hopeless 0 2 1 0 0  PHQ - 2 Score 0 2 1 0 0  Altered sleeping 0 0 0 1 1  Tired, decreased energy 0 0 '1 1 1  '$ Change in appetite 0 0 0 0 0  Feeling bad or failure about yourself  0 0 0 0 0  Trouble concentrating 0 0 0 0 0  Moving slowly or fidgety/restless 0 0 0 0 0  Suicidal thoughts 0 0 0 0 0  PHQ-9 Score 0 '2 2 2 2  '$ Difficult doing work/chores Not difficult at all Not difficult at all Not difficult at all Not difficult at all Not difficult at all   -Current Treatments: Effexor 150 mg daily -Patient is compliant with the above medications at above dose and reports no side effects.  -Past Treatments: Trintellix, Effexor, Xanax, Buspar, Lexapro -Counseling: No   HLD: -Medications: Nothing currently -Failed Medications: Vascepa caused headaches, no issues with Crestor  -Patient is compliant with above medications and reports no side effects.  -Last lipid panel: Lipid Panel     Component Value Date/Time   CHOL 181 03/21/2021 1139   TRIG 551 (H) 03/21/2021 1139   HDL 30 (L) 03/21/2021 1139   CHOLHDL 6.0 (H) 03/21/2021 1139   LDLCALC  03/21/2021 1139     Comment:     . LDL cholesterol not calculated. Triglyceride levels greater than 400 mg/dL invalidate calculated LDL results. . Reference range: <100 . Desirable range <100 mg/dL for primary prevention;   <70 mg/dL for patients with CHD or diabetic patients  with > or = 2 CHD risk factors. Marland Kitchen LDL-C is now calculated using the Martin-Hopkins   calculation, which is a validated novel method providing  better accuracy than the Friedewald equation in the  estimation of LDL-C.  Cresenciano Genre et al. Annamaria Helling. [id number]): 2061-2068  (http://education.[facility].com/faq/FAQ164)    Health Maintenance: -Blood work due -Vaccines up-to-date, other than flu which he declines today.  Past Medical History:  Diagnosis Date   Anxiety    Depression    History of kidney stones    Kidney calculi     Past Surgical History:  Procedure Laterality Date   APPENDECTOMY     IR URETERAL STENT LEFT NEW ACCESS W/O SEP NEPHROSTOMY CATH  01/31/2019   NEPHROLITHOTOMY Left 01/31/2019   Procedure: NEPHROLITHOTOMY PERCUTANEOUS/ INTERVENTIONAL RADIOLOGY TO PLACE NEPHROSTOMY TUBE PRIOR;  Surgeon: Raynelle Bring, MD;  Location: [facility];  Service: Urology;  Laterality: Left;    Family History  Problem Relation Age of Onset   Anxiety disorder Mother    Anxiety disorder Father    Kidney disease Neg Hx    Prostate cancer Neg Hx    Bladder Cancer Neg Hx    Kidney cancer Neg Hx     Social History   Socioeconomic History   Marital status: Married    Spouse name: Myriam Jacobson  Number of children: 3   Years of education: Not on file   Highest education level: Not on file  Occupational History   Not on file  Tobacco Use   Smoking status: Former    Packs/day: 0.50    Years: 4.00    Total pack years: 2.00    Types: Cigarettes   Smokeless tobacco: Current    Types: Chew   Tobacco comments:    quit 9 years  Vaping Use   Vaping Use: Never used  Substance and Sexual Activity   Alcohol use: Yes    Comment: rare   Drug use: No   Sexual activity: Yes    Partners: Female  Other Topics Concern   Not on file  Social History Narrative   Married, Wife is Myriam Jacobson, has a 39 (son), 6 (son), and 3yo (daughter)   Social Determinants of Health   Financial Resource Strain: Low Risk  (10/29/2017)   Overall Financial Resource Strain (CARDIA)    Difficulty of  Paying Living Expenses: Not hard at all  Food Insecurity: No Food Insecurity (10/29/2017)   Hunger Vital Sign    Worried About Running Out of Food in the Last Year: Never true    [place] in the Last Year: Never true  Transportation Needs: No Transportation Needs (10/29/2017)   PRAPARE - Hydrologist (Medical): No    Lack of Transportation (Non-Medical): No  Physical Activity: Sufficiently Active (01/29/2018)   Exercise Vital Sign    Days of Exercise per Week: 4 days    Minutes of Exercise per Session: 60 min  Stress: Stress Concern Present (01/29/2018)   [place]    Feeling of Stress : To some extent  Social Connections: Unknown (01/29/2018)   Social Connection and Isolation Panel [NHANES]    Frequency of Communication with Friends and Family: Not on file    Frequency of Social Gatherings with Friends and Family: Once a week    Attends Religious Services: Not on file    Active Member of Clubs or Organizations: Not on file    Attends Archivist Meetings: Not on file    Marital Status: Not on file  Intimate Partner Violence: Not At Risk (10/29/2017)   Humiliation, Afraid, Rape, and Kick questionnaire    Fear of Current or Ex-Partner: No    Emotionally Abused: No    Physically Abused: No    Sexually Abused: No    Outpatient Medications Prior to Visit  Medication Sig Dispense Refill   ALPRAZolam (XANAX) 0.25 MG tablet Take 1 tablet (0.25 mg total) by mouth 2 (two) times daily as needed for anxiety. To last 1 year. 15 tablet 0   venlafaxine XR (EFFEXOR XR) 150 MG 24 hr capsule Take one tab daily 90 capsule 3   icosapent Ethyl (VASCEPA) 1 g capsule Take 2 capsules (2 g total) by mouth 2 (two) times daily. (Patient not taking: Reported on 03/21/2022) 120 capsule 3   rosuvastatin (CRESTOR) 5 MG tablet Take 1 tablet (5 mg total) by mouth daily. (Patient not taking: Reported on  03/21/2022) 30 tablet 0   No facility-administered medications prior to visit.    No Known Allergies  Review of Systems  All other systems reviewed and are negative.      Objective:    Physical Exam Constitutional:      Appearance: Normal appearance.  HENT:     Head: Normocephalic and  atraumatic.     Mouth/Throat:     Mouth: Mucous membranes are moist.     Pharynx: Oropharynx is clear.  Eyes:     Extraocular Movements: Extraocular movements intact.     Conjunctiva/sclera: Conjunctivae normal.     Pupils: Pupils are equal, round, and reactive to light.  Neck:     Comments: No thyromegaly Cardiovascular:     Rate and Rhythm: Normal rate and regular rhythm.  Pulmonary:     Effort: Pulmonary effort is normal.     Breath sounds: Normal breath sounds.  Musculoskeletal:     Cervical back: Neck supple. No tenderness.     Right lower leg: No edema.     Left lower leg: No edema.  Lymphadenopathy:     Cervical: No cervical adenopathy.  Skin:    General: Skin is warm and dry.  Neurological:     General: No focal deficit present.     Mental Status: He is alert. Mental status is at baseline.  Psychiatric:        Mood and Affect: Mood normal.        Behavior: Behavior normal.     BP 124/78   Pulse 93   Temp 97.8 F (36.6 C)   Resp 16   Ht '5\' 10"'$  (1.778 m)   Wt 211 lb 11.2 oz (96 kg)   SpO2 95%   BMI 30.38 kg/m  Wt Readings from Last 3 Encounters:  03/21/22 211 lb 11.2 oz (96 kg)  10/07/21 208 lb 1.6 oz (94.4 kg)  03/21/21 201 lb 8 oz (91.4 kg)    There are no preventive care reminders to display for this patient.   There are no preventive care reminders to display for this patient.   No results found for: "TSH" Lab Results  Component Value Date   WBC 7.4 03/21/2021   HGB 15.1 03/21/2021   HCT 44.9 03/21/2021   MCV 86.5 03/21/2021   PLT 267 03/21/2021   Lab Results  Component Value Date   NA 138 03/21/2021   K 4.2 03/21/2021   CO2 27 03/21/2021    GLUCOSE 86 03/21/2021   BUN 9 03/21/2021   CREATININE 0.93 03/21/2021   BILITOT 0.4 03/21/2021   ALKPHOS 101 08/28/2013   AST 43 (H) 03/21/2021   ALT 57 (H) 03/21/2021   PROT 7.8 03/21/2021   ALBUMIN 4.4 08/28/2013   CALCIUM 9.9 03/21/2021   ANIONGAP 10 02/03/2019   EGFR 112 03/21/2021   Lab Results  Component Value Date   CHOL 181 03/21/2021   Lab Results  Component Value Date   HDL 30 (L) 03/21/2021   Lab Results  Component Value Date   [facility]  03/21/2021     Comment:     . LDL cholesterol not calculated. Triglyceride levels greater than 400 mg/dL invalidate calculated LDL results. . Reference range: <100 . Desirable range <100 mg/dL for primary prevention;   <70 mg/dL for patients with CHD or diabetic patients  with > or = 2 CHD risk factors. Marland Kitchen LDL-C is now calculated using the Martin-Hopkins  calculation, which is a validated novel method providing  better accuracy than the Friedewald equation in the  estimation of LDL-C.  Cresenciano Genre et al. Annamaria Helling. [id number]): 2061-2068  (http://education.[facility].com/faq/FAQ164)    Lab Results  Component Value Date   TRIG 551 (H) 03/21/2021   Lab Results  Component Value Date   CHOLHDL 6.0 (H) 03/21/2021   No results found for: "HGBA1C"  Assessment & Plan:   1. Pure hypertriglyceridemia: Recheck fasting labs today.  Patient could not tolerate Vascepa due to headaches.  He had been on Crestor in the past and tolerated that well.  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  - CBC w/Diff/Platelet - COMPLETE METABOLIC PANEL WITH GFR - Lipid Profile  2. Generalized anxiety disorder/Moderate episode of recurrent major depressive disorder ([place]): Stable, symptoms well controlled. Continue Effexor 150 mg daily, refilled.  - Venlafaxine XR (EFFEXOR XR) 150 MG 24 hr capsule; Take one tab daily  Dispense: 90 capsule; Refill: Graham, DO

## 2022-03-21 ENCOUNTER — Encounter: Payer: Self-pay | Admitting: Internal Medicine

## 2022-03-21 ENCOUNTER — Ambulatory Visit (INDEPENDENT_AMBULATORY_CARE_PROVIDER_SITE_OTHER): Payer: No Typology Code available for payment source | Admitting: Internal Medicine

## 2022-03-21 VITALS — BP 124/78 | HR 93 | Temp 97.8°F | Resp 16 | Ht 70.0 in | Wt 211.7 lb

## 2022-03-21 DIAGNOSIS — F331 Major depressive disorder, recurrent, moderate: Secondary | ICD-10-CM | POA: Diagnosis not present

## 2022-03-21 DIAGNOSIS — E781 Pure hyperglyceridemia: Secondary | ICD-10-CM

## 2022-03-21 DIAGNOSIS — F411 Generalized anxiety disorder: Secondary | ICD-10-CM | POA: Diagnosis not present

## 2022-03-21 MED ORDER — VENLAFAXINE HCL ER 150 MG PO CP24: ORAL_CAPSULE | ORAL | 3 refills | Status: DC

## 2022-03-21 NOTE — Patient Instructions (Signed)
It was great seeing you today!  Plan discussed at today's visit: -Blood work ordered today, results will be uploaded to MyChart.  -Effexor refilled   Follow up in: 1 year or sooner as needed  Take care and let us know if you have any questions or concerns prior to your next visit.  Dr. Rosana Berger

## 2022-03-22 LAB — COMPLETE METABOLIC PANEL WITH GFR
AG Ratio: 1.4 (calc) (ref 1.0–2.5)
ALT: 127 U/L — ABNORMAL HIGH (ref 9–46)
AST: 68 U/L — ABNORMAL HIGH (ref 10–40)
Albumin: 4.6 g/dL (ref 3.6–5.1)
Alkaline phosphatase (APISO): 116 U/L (ref 36–130)
BUN: 12 mg/dL (ref 7–25)
CO2: 26 mmol/L (ref 20–32)
Calcium: 10.2 mg/dL (ref 8.6–10.3)
Chloride: 99 mmol/L (ref 98–110)
Creat: 0.95 mg/dL (ref 0.60–1.26)
Globulin: 3.4 g/dL (calc) (ref 1.9–3.7)
Glucose, Bld: 84 mg/dL (ref 65–99)
Potassium: 4.5 mmol/L (ref 3.5–5.3)
Sodium: 135 mmol/L (ref 135–146)
Total Bilirubin: 0.5 mg/dL (ref 0.2–1.2)
Total Protein: 8 g/dL (ref 6.1–8.1)
eGFR: 108 mL/min/{1.73_m2} (ref >=60)

## 2022-03-22 LAB — CBC WITH DIFFERENTIAL/PLATELET
Absolute Monocytes: 828 cells/uL (ref 200–950)
Basophils Absolute: 79 cells/uL (ref 0–200)
Basophils Relative: 1.1 %
Eosinophils Absolute: 317 cells/uL (ref 15–500)
Eosinophils Relative: 4.4 %
HCT: 46.3 % (ref 38.5–50.0)
Hemoglobin: 15.9 g/dL (ref 13.2–17.1)
Lymphs Abs: 2232 cells/uL (ref 850–3900)
MCH: 29.3 pg (ref 27.0–33.0)
MCHC: 34.3 g/dL (ref 32.0–36.0)
MCV: 85.4 fL (ref 80.0–100.0)
MPV: 11.3 fL (ref 7.5–12.5)
Monocytes Relative: 11.5 %
Neutro Abs: 3744 cells/uL (ref 1500–7800)
Neutrophils Relative %: 52 %
Platelets: 244 10*3/uL (ref 140–400)
RBC: 5.42 10*6/uL (ref 4.20–5.80)
RDW: 13.9 % (ref 11.0–15.0)
Total Lymphocyte: 31 %
WBC: 7.2 10*3/uL (ref 3.8–10.8)

## 2022-03-22 LAB — LIPID PANEL
Cholesterol: 492 mg/dL — ABNORMAL HIGH (ref <200)
HDL: 32 mg/dL — ABNORMAL LOW (ref >=40)
Non-HDL Cholesterol (Calc): 460 mg/dL (calc) — ABNORMAL HIGH (ref <130)
Total CHOL/HDL Ratio: 15.4 (calc) — ABNORMAL HIGH (ref <5.0)
Triglycerides: 1337 mg/dL — ABNORMAL HIGH (ref <150)

## 2022-03-25 ENCOUNTER — Other Ambulatory Visit: Payer: Self-pay | Admitting: Internal Medicine

## 2022-03-25 DIAGNOSIS — E781 Pure hyperglyceridemia: Secondary | ICD-10-CM

## 2022-03-25 MED ORDER — ROSUVASTATIN CALCIUM 10 MG PO TABS: 10.0000 mg | ORAL_TABLET | Freq: Every day | ORAL | 0 refills | Status: DC

## 2022-03-25 MED ORDER — FENOFIBRATE 145 MG PO TABS: 145.0000 mg | ORAL_TABLET | Freq: Every day | ORAL | 0 refills | Status: DC

## 2022-06-12 ENCOUNTER — Telehealth: Payer: Self-pay | Admitting: Internal Medicine

## 2022-06-12 NOTE — Telephone Encounter (Signed)
Copied from CRM [id number]. Topic: General - Other >> Jun 12, 2022  9:36 AM [place] B wrote: Reason for CRM: pt called in says company for his job [employer] faxed in paperwork to be filled out about his anxiety med not affecting doing his job. Please call back if received.

## 2022-06-12 NOTE — Telephone Encounter (Signed)
Pt brought form in and we signed

## 2022-07-03 ENCOUNTER — Other Ambulatory Visit: Payer: Self-pay | Admitting: Internal Medicine

## 2022-07-03 DIAGNOSIS — E781 Pure hyperglyceridemia: Secondary | ICD-10-CM

## 2022-07-03 NOTE — Telephone Encounter (Signed)
Requested Prescriptions  Pending Prescriptions Disp Refills   rosuvastatin (CRESTOR) 10 MG tablet [Pharmacy Med Name: ROSUVASTATIN CALCIUM 10 MG TAB] 90 tablet 2    Sig: TAKE 1 TABLET BY MOUTH EVERY DAY     Cardiovascular:  Antilipid - Statins 2 Failed - 07/03/2022  2:39 AM      Failed - Lipid Panel in normal range within the last 12 months    Cholesterol  Date Value Ref Range Status  03/21/2022 492 (H) <200 mg/dL Final   LDL Cholesterol (Calc)  Date Value Ref Range Status  03/21/2022  mg/dL (calc) Final    Comment:    . LDL cholesterol not calculated. Triglyceride levels greater than 400 mg/dL invalidate calculated LDL results. . Reference range: <100 . Desirable range <100 mg/dL for primary prevention;   <70 mg/dL for patients with CHD or diabetic patients  with > or = 2 CHD risk factors. Marland Kitchen LDL-C is now calculated using the Martin-Hopkins  calculation, which is a validated novel method providing  better accuracy than the Friedewald equation in the  estimation of LDL-C.  [name] Lenox Ahr. 1610;960(45): 2061-2068  (http://education.[facility].com/faq/FAQ164)    HDL  Date Value Ref Range Status  03/21/2022 32 (L) > OR = 40 mg/dL Final   Triglycerides  Date Value Ref Range Status  03/21/2022 1,337 (H) <150 mg/dL Final    Comment:    Verified by repeat analysis. Marland Kitchen . If a non-fasting specimen was collected, consider repeat triglyceride testing on a fasting specimen if clinically indicated.  [name] J. of Clin. Lipidol. 2015;9:129-169. . . There is increased risk of pancreatitis when the  triglyceride concentration is very high  (> or = 500 mg/dL, especially if > or = 4098 mg/dL).  [name] J. of Clin. Lipidol. 2015;9:129-169. Marland Kitchen          Passed - Cr in normal range and within 360 days    Creat  Date Value Ref Range Status  03/21/2022 0.95 0.60 - 1.26 mg/dL Final         Passed - Patient is not pregnant      Passed - Valid  encounter within last 12 months    Recent Outpatient Visits           3 months ago Pure hypertriglyceridemia   [place] [facility] Margarita Mail, DO   8 months ago Carpal tunnel syndrome of left wrist   [facility] Margarita Mail, DO   1 year ago Pure hypertriglyceridemia   [facility] Margarita Mail, DO   2 years ago Nephrolithiasis   [facility] [name], DO   3 years ago Seasonal allergic rhinitis due to pollen   [facility] Jamelle Haring, MD       Future Appointments             In 8 months Margarita Mail, DO [facility]             fenofibrate (TRICOR) 145 MG tablet [Pharmacy Med Name: FENOFIBRATE 145 MG TABLET] 90 tablet 2    Sig: TAKE 1 TABLET BY MOUTH EVERY DAY     Cardiovascular:  Antilipid - Fibric Acid Derivatives Failed - 07/03/2022  2:39 AM      Failed - ALT in normal range and within 360 days    ALT  Date Value Ref Range Status  03/21/2022 127 (H)  9 - 46 U/L Final   SGPT (ALT)  Date Value Ref Range Status  08/28/2013 46 U/L Final    Comment:    14-63 NOTE: New Reference Range 08/16/13          Failed - AST in normal range and within 360 days    AST  Date Value Ref Range Status  03/21/2022 68 (H) 10 - 40 U/L Final   SGOT(AST)  Date Value Ref Range Status  08/28/2013 44 (H) 15 - 37 Unit/L Final         Failed - Lipid Panel in normal range within the last 12 months    Cholesterol  Date Value Ref Range Status  03/21/2022 492 (H) <200 mg/dL Final   LDL Cholesterol (Calc)  Date Value Ref Range Status  03/21/2022  mg/dL (calc) Final    Comment:    . LDL cholesterol not calculated. Triglyceride levels greater than 400 mg/dL invalidate calculated LDL results. . Reference range: <100 . Desirable range <100 mg/dL for primary prevention;   <70 mg/dL for  patients with CHD or diabetic patients  with > or = 2 CHD risk factors. Marland Kitchen LDL-C is now calculated using the Martin-Hopkins  calculation, which is a validated novel method providing  better accuracy than the Friedewald equation in the  estimation of LDL-C.  [name] Lenox Ahr. 1610;960(45): 2061-2068  (http://education.[facility].com/faq/FAQ164)    HDL  Date Value Ref Range Status  03/21/2022 32 (L) > OR = 40 mg/dL Final   Triglycerides  Date Value Ref Range Status  03/21/2022 1,337 (H) <150 mg/dL Final    Comment:    Verified by repeat analysis. Marland Kitchen . If a non-fasting specimen was collected, consider repeat triglyceride testing on a fasting specimen if clinically indicated.  [name] J. of Clin. Lipidol. 2015;9:129-169. . . There is increased risk of pancreatitis when the  triglyceride concentration is very high  (> or = 500 mg/dL, especially if > or = 4098 mg/dL).  [name] J. of Clin. Lipidol. 2015;9:129-169. Marland Kitchen          Passed - Cr in normal range and within 360 days    Creat  Date Value Ref Range Status  03/21/2022 0.95 0.60 - 1.26 mg/dL Final         Passed - HGB in normal range and within 360 days    Hemoglobin  Date Value Ref Range Status  03/21/2022 15.9 13.2 - 17.1 g/dL Final   HGB  Date Value Ref Range Status  08/28/2013 15.1 13.0 - 18.0 g/dL Final         Passed - HCT in normal range and within 360 days    HCT  Date Value Ref Range Status  03/21/2022 46.3 38.5 - 50.0 % Final  08/28/2013 45.5 40.0 - 52.0 % Final         Passed - PLT in normal range and within 360 days    Platelets  Date Value Ref Range Status  03/21/2022 244 140 - 400 Thousand/uL Final   Platelet  Date Value Ref Range Status  08/28/2013 248 150 - 440 x10 3/mm 3 Final         Passed - WBC in normal range and within 360 days    WBC  Date Value Ref Range Status  03/21/2022 7.2 3.8 - 10.8 Thousand/uL Final         Passed - eGFR is 30 or above and  within 360 days  GFR, Est African American  Date Value Ref Range Status  11/24/2018 136 > OR = 60 mL/min/1.86m2 Final   GFR calc Af Amer  Date Value Ref Range Status  02/03/2019 >60 >60 mL/min Final   GFR, Est Non African American  Date Value Ref Range Status  11/24/2018 117 > OR = 60 mL/min/1.48m2 Final   GFR calc non Af Amer  Date Value Ref Range Status  02/03/2019 >60 >60 mL/min Final   eGFR  Date Value Ref Range Status  03/21/2022 108 > OR = 60 mL/min/1.43m2 Final         Passed - Valid encounter within last 12 months    Recent Outpatient Visits           3 months ago Pure hypertriglyceridemia   [facility] Margarita Mail, DO   8 months ago Carpal tunnel syndrome of left wrist   [facility] Margarita Mail, DO   1 year ago Pure hypertriglyceridemia   [facility] Margarita Mail, DO   2 years ago Nephrolithiasis   [facility] [name], DO   3 years ago Seasonal allergic rhinitis due to pollen   [facility] Jamelle Haring, MD       Future Appointments             In 8 months Margarita Mail, DO [facility]

## 2022-08-27 ENCOUNTER — Ambulatory Visit: Payer: Self-pay | Admitting: *Deleted

## 2022-08-27 NOTE — Telephone Encounter (Signed)
Per Dr.Andrews

## 2022-08-27 NOTE — Telephone Encounter (Signed)
Pt called back was in a paramedic class, a paramedic took his bp was 138/86. Per Dr.Andrews keep appt for 8/6 and continue monitoring on a daily.

## 2022-08-27 NOTE — Telephone Encounter (Signed)
Lvm informing pt to come by the office this morning before Dr Caralee Ates leaves to get BP checked

## 2022-08-27 NOTE — Telephone Encounter (Signed)
  Chief Complaint: elevated BP last week with sx  Symptoms: headaches, blurred vision with exercise. Last week episode sx with BP 170/100. Does not have monitor to check now. Did get SOB with exertion. Does not have monitor to check BP now denies sx now  Frequency: last week  Pertinent Negatives: Patient denies chest pain no difficulty breathing  Disposition: [] ED /[x] Urgent Care (no appt availability in office) / [] Appointment(In office/virtual)/ []  [facility]/ [] Home Care/ [] Refused Recommended Disposition /[] [place] [facility]/ []  Follow-up with PCP Additional Notes:   Recommended UC today or going to pharmacy to check BP  and if elevated go to UC. No available appt with PCP today or tomorrow. Scheduled appt 09/02/22. Please advise if patient able to be seen earlier than 09/02/22     Reason for Disposition  Systolic BP  >= 180 OR Diastolic >= 110    Unknown BP today . Last week BP 170/100  Answer Assessment - Initial Assessment Questions 1. BLOOD PRESSURE: "What is the blood pressure?" "Did you take at least two measurements 5 minutes apart?"     Last week 170/100 when exercising. Has not taken BP since. Not able to check now  2. ONSET: "When did you take your blood pressure?"     Last week during exercise  3. HOW: "How did you take your blood pressure?" (e.g., automatic home BP monitor, visiting nurse)     Paramedic on site  4. HISTORY: "Do you have a history of high blood pressure?"     na 5. MEDICINES: "Are you taking any medicines for blood pressure?" "Have you missed any doses recently?"     na 6. OTHER SYMPTOMS: "Do you have any symptoms?" (e.g., blurred vision, chest pain, difficulty breathing, headache, weakness)     Headaches , blurred vision after exercise some SOB at times after working out  7. PREGNANCY: "Is there any chance you are pregnant?" "When was your last menstrual period?"     na  Protocols used: Blood Pressure - High-A-AH

## 2022-09-01 NOTE — Progress Notes (Unsigned)
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 Previously he was exercising a few times a week for about 20 minutes. He states during the first work out he had some dizziness and nose bleed, second time same problems and BP was checked and it was 170's/100's. He does not have a personal history of HTN and bp is normal at rest. He states once had some discomfort on left axillary line , he states symptoms is usually dizziness . Once he walks symptoms resolved. He has been off statin and tricor since he lost his insurance. There is a family history of heart disease, father at age 44.    Patient Active Problem List   Diagnosis Date Noted   Seasonal allergic rhinitis due to pollen 06/22/2019   Moderate episode of recurrent major depressive disorder (HCC) 04/25/2019   Kidney stone 01/31/2019   Shifting sleep-work schedule 04/30/2018   Mild persistent asthma without complication 04/30/2018   Pure hypertriglyceridemia 10/29/2017   Generalized anxiety disorder 10/29/2017   Overweight (BMI 25.0-29.9) 10/29/2017   Fatigue 10/29/2017   Gross hematuria 04/29/2017   Nephrolithiasis 04/29/2017    Past Surgical History:  Procedure Laterality Date   APPENDECTOMY     IR URETERAL STENT LEFT NEW ACCESS W/O SEP NEPHROSTOMY CATH  01/31/2019   NEPHROLITHOTOMY Left 01/31/2019   Procedure: NEPHROLITHOTOMY PERCUTANEOUS/ INTERVENTIONAL RADIOLOGY TO PLACE NEPHROSTOMY TUBE PRIOR;  Surgeon: Heloise Purpura, MD;  Location: [facility];  Service: Urology;  Laterality: Left;    Family History  Problem Relation Age of Onset   Anxiety disorder Mother     Anxiety disorder Father    Kidney disease Neg Hx    Prostate cancer Neg Hx    Bladder Cancer Neg Hx    Kidney cancer Neg Hx     Social History   Tobacco Use   Smoking status: Former    Current packs/day: 0.50    Average packs/day: 0.5 packs/day for 4.0 years (2.0 ttl pk-yrs)    Types: Cigarettes   Smokeless tobacco: Current    Types: Chew   Tobacco comments:    quit 9 years  Substance Use Topics   Alcohol use: Yes    Comment: rare     Current Outpatient Medications:    venlafaxine XR (EFFEXOR XR) 150 MG 24 hr capsule, Take one tab daily, Disp: 90 capsule, Rfl: 3   ALPRAZolam (XANAX) 0.25 MG tablet, Take 1 tablet (0.25 mg total) by mouth 2 (two) times daily as needed for anxiety. To last 1 year. (Patient not taking: Reported on 09/02/2022), Disp: 15 tablet, Rfl: 0   fenofibrate (TRICOR) 145 MG tablet, TAKE 1 TABLET BY MOUTH EVERY DAY (Patient not taking: Reported on 09/02/2022), Disp: 90 tablet, Rfl: 2   rosuvastatin (CRESTOR) 10 MG tablet, TAKE 1 TABLET BY MOUTH EVERY DAY (Patient not taking: Reported on 09/02/2022), Disp: 90 tablet, Rfl: 2  No Known Allergies  I personally reviewed active problem list, medication list, allergies, family history with the patient/caregiver today.   ROS  Constitutional: Negative for fever  or weight change.  Respiratory: Negative for cough and shortness of breath.   Cardiovascular: Negative for chest pain or palpitations.  Gastrointestinal: Negative for abdominal pain, no bowel changes.  Musculoskeletal: Negative for gait problem or joint swelling.  Skin: Negative for rash.  Neurological: Negative for dizziness or headache.  No other specific complaints in a complete review of systems (except as listed in HPI above).   Objective  Vitals:   09/02/22 0822  BP: 124/82  Pulse: 96  Resp: 16  Temp: 98.2 F (36.8 C)  TempSrc: Oral  SpO2: 98%  Weight: 212 lb 3.2 oz (96.3 kg)  Height: 5\' 10"  (1.778 m)    Body mass index is 30.45  kg/m.  Physical Exam  Constitutional: Patient appears well-developed and well-nourished. Obese  No distress.  HEENT: head atraumatic, normocephalic, pupils equal and reactive to light, neck supple, Cardiovascular: Normal rate, regular rhythm and normal heart sounds.  No murmur heard. No BLE edema. Pulmonary/Chest: Effort normal and breath sounds normal. No respiratory distress. Abdominal: Soft.  There is no tenderness. Psychiatric: Patient has a normal mood and affect. behavior is normal. Judgment and thought content normal.    PHQ2/9:    09/02/2022    8:25 AM 03/21/2022   11:13 AM 10/07/2021    3:16 PM 03/21/2021   11:22 AM 03/27/2020    8:18 AM  Depression screen PHQ 2/9  Decreased Interest 0 0 0 0 0  Down, Depressed, Hopeless 0 0 2 1 0  PHQ - 2 Score 0 0 2 1 0  Altered sleeping 0 0 0 0 1  Tired, decreased energy 0 0 0 1 1  Change in appetite 0 0 0 0 0  Feeling bad or failure about yourself  0 0 0 0 0  Trouble concentrating 0 0 0 0 0  Moving slowly or fidgety/restless 0 0 0 0 0  Suicidal thoughts 0 0 0 0 0  PHQ-9 Score 0 0 2 2 2   Difficult doing work/chores  Not difficult at all Not difficult at all Not difficult at all Not difficult at all    phq 9 is negative   Fall Risk:    09/02/2022    8:24 AM 03/21/2022   11:11 AM 10/07/2021    3:14 PM 03/21/2021   11:17 AM 03/27/2020    8:08 AM  Fall Risk   Falls in the past year? 0 0 0 0 0  Number falls in past yr:  0 0 0 0  Injury with Fall?  0 0 0 0  Risk for fall due to : No Fall Risks      Follow up Falls prevention discussed          Functional Status Survey: Is the patient deaf or have difficulty hearing?: No Does the patient have difficulty seeing, even when wearing glasses/contacts?: No Does the patient have difficulty concentrating, remembering, or making decisions?: No Does the patient have difficulty walking or climbing stairs?: No Does the patient have difficulty dressing or bathing?: No Does the patient have  difficulty doing errands alone such as visiting a doctor's office or shopping?: No    Assessment & Plan  1. Elevated blood pressure, situational  During exercise only, advised to take breaks before you get dizzy, it should be at most 10 minutes of activity with about 5 minutes of rest and progress as tolerated. Check bp before and after activity, make sure it gets twice once exercise is done. Call 911 if chest pain, sob  that does not resolve when you stop activity  Discussed likely secondary to rapid increase in physical activity, we will write a note to allow him to take regular breaks, also advised to use albuterol 30 minutes prior to work out, if symptoms persists he will need to see cardiologist, but I believe symptoms will gradually improve once he gets in better shape   2. Asthma, mild intermittent, well-controlled  - albuterol (VENTOLIN HFA) 108 (90 Base) MCG/ACT inhaler; Inhale 2 puffs into the lungs every 6 (six) hours as needed for wheezing or shortness of breath.  Dispense: 18 g; Refill: 0  Before activity   3. Pure hypertriglyceridemia  - fenofibrate (TRICOR) 145 MG tablet; Take 1 tablet (145 mg total) by mouth daily.  Dispense: 90 tablet; Refill: 2 - rosuvastatin (CRESTOR) 10 MG tablet; Take 1 tablet (10 mg total) by mouth daily.  Dispense: 90 tablet; Refill: 2  4. Family history of heart disease

## 2022-09-02 ENCOUNTER — Ambulatory Visit (INDEPENDENT_AMBULATORY_CARE_PROVIDER_SITE_OTHER): Payer: Self-pay | Admitting: Family Medicine

## 2022-09-02 ENCOUNTER — Encounter: Payer: Self-pay | Admitting: Family Medicine

## 2022-09-02 VITALS — BP 124/82 | HR 96 | Temp 98.2°F | Resp 16 | Ht 70.0 in | Wt 212.2 lb

## 2022-09-02 DIAGNOSIS — R030 Elevated blood-pressure reading, without diagnosis of hypertension: Secondary | ICD-10-CM

## 2022-09-02 DIAGNOSIS — E781 Pure hyperglyceridemia: Secondary | ICD-10-CM

## 2022-09-02 DIAGNOSIS — J4520 Mild intermittent asthma, uncomplicated: Secondary | ICD-10-CM

## 2022-09-02 DIAGNOSIS — Z8249 Family history of ischemic heart disease and other diseases of the circulatory system: Secondary | ICD-10-CM

## 2022-09-02 MED ORDER — FENOFIBRATE 145 MG PO TABS: 145.0000 mg | ORAL_TABLET | Freq: Every day | ORAL | 2 refills | Status: DC

## 2022-09-02 MED ORDER — ALBUTEROL SULFATE HFA 108 (90 BASE) MCG/ACT IN AERS
2.0000 | INHALATION_SPRAY | Freq: Four times a day (QID) | RESPIRATORY_TRACT | 0 refills | Status: DC | PRN
Start: 2022-09-02 — End: 2023-05-26

## 2022-09-02 MED ORDER — ROSUVASTATIN CALCIUM 10 MG PO TABS: 10.0000 mg | ORAL_TABLET | Freq: Every day | ORAL | 2 refills | Status: DC

## 2022-12-18 ENCOUNTER — Encounter: Payer: Self-pay | Admitting: Physician Assistant

## 2022-12-18 ENCOUNTER — Ambulatory Visit (INDEPENDENT_AMBULATORY_CARE_PROVIDER_SITE_OTHER): Payer: Self-pay | Admitting: Physician Assistant

## 2022-12-18 VITALS — BP 130/88 | HR 89 | Temp 98.0°F | Resp 16 | Ht 70.0 in | Wt 209.2 lb

## 2022-12-18 DIAGNOSIS — S46811A Strain of other muscles, fascia and tendons at shoulder and upper arm level, right arm, initial encounter: Secondary | ICD-10-CM

## 2022-12-18 DIAGNOSIS — M25511 Pain in right shoulder: Secondary | ICD-10-CM

## 2022-12-18 MED ORDER — PREDNISONE 20 MG PO TABS
ORAL_TABLET | ORAL | 0 refills | Status: DC
Start: 2022-12-18 — End: 2023-05-26

## 2022-12-18 MED ORDER — MELOXICAM 7.5 MG PO TABS
7.5000 mg | ORAL_TABLET | Freq: Every day | ORAL | 0 refills | Status: DC
Start: 2022-12-18 — End: 2023-01-15

## 2022-12-18 NOTE — Progress Notes (Signed)
Acute Office Visit   Patient: Adam Palmer   DOB: 10-06-1990   32 y.o. Male  MRN: [id number] Visit Date: 12/18/2022  Today's healthcare provider: Oswaldo Conroy Tyshawna Alarid, PA-C  Introduced myself to the patient as a Secondary school teacher and provided education on APPs in clinical practice.    Chief Complaint  Patient presents with   Shoulder Pain    R shoulder onset for weeks can radiate to arm. Has tried OTC Tylenol/ibuprofen no help, icy hot patches no help   Subjective    HPI HPI     Shoulder Pain    Additional comments: R shoulder onset for weeks can radiate to arm. Has tried OTC Tylenol/ibuprofen no help, icy hot patches no help      Last edited by Forde Radon, CMA on 12/18/2022 10:20 AM.       Right shoulder pain  Onset: sudden Denies injury or strains Duration: 2 weeks  Location:middle of upper thoracic back  Radiation: radiates into right shoulder and down to right elbow Pain level and character: 8/10 with shooting pain from shoulder to elbow  Other associated symptoms: denies weakness, numbness or tingling  Interventions: heating pad, ice packs, Ibuprofen, tylenol  Alleviating: external rotation with right hand behind head seems to provide relief for a little bit  Aggravating: nothing, pain is constant   He denies increased pain with head rotation or flexion  Medications: Outpatient Medications Prior to Visit  Medication Sig   albuterol (VENTOLIN HFA) 108 (90 Base) MCG/ACT inhaler Inhale 2 puffs into the lungs every 6 (six) hours as needed for wheezing or shortness of breath.   fenofibrate (TRICOR) 145 MG tablet Take 1 tablet (145 mg total) by mouth daily.   rosuvastatin (CRESTOR) 10 MG tablet Take 1 tablet (10 mg total) by mouth daily.   venlafaxine XR (EFFEXOR XR) 150 MG 24 hr capsule Take one tab daily   No facility-administered medications prior to visit.    Review of Systems  Musculoskeletal:  Positive for arthralgias, back pain and myalgias.         Objective    BP 130/88   Pulse 89   Temp 98 F (36.7 C) (Oral)   Resp 16   Ht 5\' 10"  (1.778 m)   Wt 209 lb 3.2 oz (94.9 kg)   SpO2 97%   BMI 30.02 kg/m     Physical Exam Vitals reviewed.  Constitutional:      General: He is awake.     Appearance: Normal appearance. He is well-developed and well-groomed.  HENT:     Head: Atraumatic.  Eyes:     General: Lids are normal. Gaze aligned appropriately.  Neck:     Comments: No palpable step-offs or abnormalities on exam of cervical spine or upper thoracic spine Pulmonary:     Effort: Pulmonary effort is normal.  Musculoskeletal:     Cervical back: Normal range of motion. No rigidity. Normal range of motion.     Comments: Thoracic ROM: Intact and symmetrical with regards to lateral flexion and lateral rotation  Shoulder ROM: Intact and symmetrical with regards to internal and external rotation, extension, flexion, abduction, adduction  Negative drop arm test on the right side  Neurological:     General: No focal deficit present.     Mental Status: He is alert and oriented to person, place, and time.     GCS: GCS eye subscore is 4. GCS verbal subscore is 5. GCS motor subscore  is 6.     Cranial Nerves: No cranial nerve deficit, dysarthria or facial asymmetry.     Motor: No weakness, tremor, atrophy or abnormal muscle tone.  Psychiatric:        Behavior: Behavior is cooperative.       No results found for any visits on 12/18/22.  Assessment & Plan      No follow-ups on file.      Problem List Items Addressed This Visit   None Visit Diagnoses     Acute pain of right shoulder    -  Primary   Relevant Medications   predniSONE (DELTASONE) 20 MG tablet   meloxicam (MOBIC) 7.5 MG tablet   Other Relevant Orders   Ambulatory referral to Physical Therapy   Strain of right supraspinatus muscle or tendon       Relevant Medications   predniSONE (DELTASONE) 20 MG tablet   meloxicam (MOBIC) 7.5 MG tablet   Other  Relevant Orders   Ambulatory referral to Physical Therapy   Strain of right trapezius muscle, initial encounter       Relevant Medications   predniSONE (DELTASONE) 20 MG tablet   meloxicam (MOBIC) 7.5 MG tablet   Other Relevant Orders   Ambulatory referral to Physical Therapy      Acute, new concern Patient reports ongoing pain of right trapezius and shoulder area with radiation into upper arm Physical exam findings suggest likely muscle injury with potential axillary nerve involvement given shooting pain that he experiences with certain movements Suspect more muscular etiology versus osseous so no imaging today.  If symptoms persist despite intervention may need to order imaging but will defer today Will provide prednisone 7-day taper to assist with inflammation along with meloxicam 7.5 mg p.o. daily for further pain relief Referral sent for physical therapy Reviewed ED and return precautions Follow-up as needed for progressing or persistent symptoms   No follow-ups on file.   I, Siegfried Vieth E Leliana Kontz, PA-C, have reviewed all documentation for this visit. The documentation on 12/18/22 for the exam, diagnosis, procedures, and orders are all accurate and complete.   Jacquelin Hawking, MHS, PA-C [facility]

## 2022-12-18 NOTE — Patient Instructions (Signed)
Based on your symptoms and physical exam I believe the following is the cause of your concern today Back pain likely secondary to a strain of your back muscles  I recommend the following at this time to help relieve that discomfort:  Rest Warm compresses to the area (20 minutes on, minimum of 30 minutes off) You can alternate Tylenol and Ibuprofen for pain management but Ibuprofen is typically preferred to reduce inflammation.  I have sent in a script for Meloxicam 7.5 mg to be taken by mouth once per day (do not use other NSAIDs while taking this) Please take the Meloxicam with plenty of water and a meal  Gentle stretches and exercises that I have included in your paperwork Try to reduce excess strain to the area and rest as much as possible  Wear supportive shoes and, if you must lift anything, use proper lifting techniques that spare your back.   If these measures do not lead to improvement in your symptoms over the next 2-4 weeks please let us know

## 2022-12-20 ENCOUNTER — Emergency Department
Admission: EM | Admit: 2022-12-20 | Discharge: 2022-12-20 | Disposition: A | Discharge status: Home / Self Care | Payer: Managed Care, Other (non HMO) | Attending: Emergency Medicine | Admitting: Emergency Medicine

## 2022-12-20 DIAGNOSIS — M542 Cervicalgia: Secondary | ICD-10-CM | POA: Diagnosis present

## 2022-12-20 DIAGNOSIS — M62838 Other muscle spasm: Secondary | ICD-10-CM | POA: Diagnosis not present

## 2022-12-20 MED ORDER — LIDOCAINE 5 % EX PTCH
1.0000 | MEDICATED_PATCH | CUTANEOUS | Status: DC
  Administered 2022-12-20: 1 via TRANSDERMAL
  Filled 2022-12-20: qty 1

## 2022-12-20 MED ORDER — KETOROLAC TROMETHAMINE 30 MG/ML IJ SOLN
30.0000 mg | Freq: Once | INTRAMUSCULAR | Status: AC
  Administered 2022-12-20: 30 mg via INTRAMUSCULAR
  Filled 2022-12-20: qty 1

## 2022-12-20 MED ORDER — CYCLOBENZAPRINE HCL 10 MG PO TABS: 10.0000 mg | ORAL_TABLET | Freq: Three times a day (TID) | ORAL | 0 refills | Status: DC | PRN

## 2022-12-20 MED ORDER — CYCLOBENZAPRINE HCL 10 MG PO TABS
5.0000 mg | ORAL_TABLET | Freq: Once | ORAL | Status: AC
  Administered 2022-12-20: 5 mg via ORAL
  Filled 2022-12-20: qty 1

## 2022-12-20 NOTE — Discharge Instructions (Addendum)
You were evaluated in the ED for neck pain.  Please follow the medication regimen as discussed.  Symptoms do not improve in 5 days please follow-up with orthopedics.

## 2022-12-20 NOTE — ED Provider Notes (Signed)
[facility] Emergency Department Provider Note     Event Date/Time   First MD Initiated Contact with Patient 12/20/22 1933     (approximate)   History   Neck Pain   HPI  Adam Palmer is a 32 y.o. male presents to the ED for evaluation of right sided neck pain x 2 weeks.  No injury or trauma.  Patient reports pain spontaneously occurred.  Denies heavy lifting or direct impact.  Endorses radiation of a tingling like sensation down right arm.  Patient was seen at urgent care for same concern on 11/21 and prescribed meloxicam and prednisone with a physical therapy referral.  Patient reports no relief.     Physical Exam   Triage Vital Signs: ED Triage Vitals  Encounter Vitals Group     BP 12/20/22 1815 (!) 141/96     Systolic BP Percentile --      Diastolic BP Percentile --      Pulse Rate 12/20/22 1815 88     Resp 12/20/22 1815 17     Temp 12/20/22 1815 98.2 F (36.8 C)     Temp Source 12/20/22 1815 Oral     SpO2 12/20/22 1815 97 %     Weight 12/20/22 1819 208 lb (94.3 kg)     Height 12/20/22 1819 5\' 10"  (1.778 m)     Head Circumference --      Peak Flow --      Pain Score 12/20/22 1819 8     Pain Loc --      Pain Education --      Exclude from Growth Chart --     Most recent vital signs: Vitals:   12/20/22 1815 12/20/22 2111  BP: (!) 141/96 136/85  Pulse: 88 85  Resp: 17 17  Temp: 98.2 F (36.8 C)   SpO2: 97% 98%    General: Well appearing. Alert and oriented. INAD.  Skin:  Warm, dry and intact. No rashes or lesions noted.     Head:  NCAT.  Eyes:  PERRLA. EOMI.  Throat: Oropharynx clear.  Airway patent. Neck:   No cervical spine tenderness to palpation. Full ROM without difficulty.  Tenderness over right trapezius muscle. CV:  Good peripheral perfusion. RRR.  RESP:  Normal effort. LCTAB. ABD:  No distention. Soft, Non tender. BACK:  Spinous process is midline without deformity or tenderness. MSK:   Full ROM in all  joints. No swelling, deformity or tenderness.  NEURO: Cranial nerves II-XII intact. No focal deficits. Sensation and motor function intact.   ED Results / Procedures / Treatments   Labs (all labs ordered are listed, but only abnormal results are displayed) Labs Reviewed - No data to display  No results found.  PROCEDURES:  Critical Care performed: No  Procedures   MEDICATIONS ORDERED IN ED: Medications  ketorolac (TORADOL) 30 MG/ML injection 30 mg (30 mg Intramuscular Given 12/20/22 1957)  cyclobenzaprine (FLEXERIL) tablet 5 mg (5 mg Oral Given 12/20/22 1957)     IMPRESSION / MDM / ASSESSMENT AND PLAN / ED COURSE  I reviewed the triage vital signs and the nursing notes.                               32 y.o. male presents to the emergency department for evaluation and treatment of neck pain. See HPI for further details.   Differential diagnosis includes, but is not limited to cervical  radiculopathy, muscle spasm, muscle strain  Patient's presentation is most consistent with acute, uncomplicated illness.   No new injury.  I do not believe this is a bony abnormality so no indication for imaging at this time.  Pain management in ED with Toradol injection, Flexeril and lidocaine patch.  On reevaluation patient reports improvement in pain.  We will trial this as outpatient treatment plan.  He is encouraged to follow-up with orthopedic if symptoms do not improve.  At this time patient is in stable and satisfactory condition for discharge home. ED precautions discussed. All questions and concerns were addressed during this ED visit.     FINAL CLINICAL IMPRESSION(S) / ED DIAGNOSES   Final diagnoses:  Trapezius muscle spasm     Rx / DC Orders   ED Discharge Orders          Ordered    cyclobenzaprine (FLEXERIL) 10 MG tablet  3 times daily PRN        12/20/22 2106             Note:  This document was prepared using Dragon voice recognition software and may include  unintentional dictation errors.    Romeo Apple, Dusti Tetro A, PA-C 12/21/22 5053    Concha Se, MD 12/21/22 [phone]

## 2022-12-20 NOTE — ED Triage Notes (Signed)
Pt reports that two weeks ago he began having R sided neck pain radiating down his R shoulder. Pt states he has hx of pinched nerve and had similar pain. Was seen at [facility] meloxicam and prednisone without relief.

## 2023-01-14 ENCOUNTER — Other Ambulatory Visit: Payer: Self-pay | Admitting: Physician Assistant

## 2023-01-14 DIAGNOSIS — M25511 Pain in right shoulder: Secondary | ICD-10-CM

## 2023-01-14 DIAGNOSIS — S46811A Strain of other muscles, fascia and tendons at shoulder and upper arm level, right arm, initial encounter: Secondary | ICD-10-CM

## 2023-01-14 NOTE — Telephone Encounter (Signed)
Requested medication (s) are due for refill today: yes  Requested medication (s) are on the active medication list: yes  Last refill:  12/18/22 #30  Future visit scheduled: yes  Notes to clinic:  overdue lab work- abnormal lab results (liver enzymes)   Requested Prescriptions  Pending Prescriptions Disp Refills   meloxicam (MOBIC) 7.5 MG tablet [Pharmacy Med Name: MELOXICAM 7.5 MG TABLET] 30 tablet 0    Sig: TAKE 1 TABLET BY MOUTH EVERY DAY     Analgesics:  COX2 Inhibitors Failed - 01/14/2023 10:07 AM      Failed - Manual Review: Labs are only required if the patient has taken medication for more than 8 weeks.      Failed - AST in normal range and within 360 days    AST  Date Value Ref Range Status  03/21/2022 68 (H) 10 - 40 U/L Final   SGOT(AST)  Date Value Ref Range Status  08/28/2013 44 (H) 15 - 37 Unit/L Final         Failed - ALT in normal range and within 360 days    ALT  Date Value Ref Range Status  03/21/2022 127 (H) 9 - 46 U/L Final   SGPT (ALT)  Date Value Ref Range Status  08/28/2013 46 U/L Final    Comment:    14-63 NOTE: New Reference Range 08/16/13          Passed - HGB in normal range and within 360 days    Hemoglobin  Date Value Ref Range Status  03/21/2022 15.9 13.2 - 17.1 g/dL Final   HGB  Date Value Ref Range Status  08/28/2013 15.1 13.0 - 18.0 g/dL Final         Passed - Cr in normal range and within 360 days    Creat  Date Value Ref Range Status  03/21/2022 0.95 0.60 - 1.26 mg/dL Final         Passed - HCT in normal range and within 360 days    HCT  Date Value Ref Range Status  03/21/2022 46.3 38.5 - 50.0 % Final  08/28/2013 45.5 40.0 - 52.0 % Final         Passed - eGFR is 30 or above and within 360 days    GFR, Est African American  Date Value Ref Range Status  11/24/2018 136 > OR = 60 mL/min/1.28m2 Final   GFR calc Af Amer  Date Value Ref Range Status  02/03/2019 >60 >60 mL/min Final   GFR, Est Non African American   Date Value Ref Range Status  11/24/2018 117 > OR = 60 mL/min/1.26m2 Final   GFR calc non Af Amer  Date Value Ref Range Status  02/03/2019 >60 >60 mL/min Final   eGFR  Date Value Ref Range Status  03/21/2022 108 > OR = 60 mL/min/1.29m2 Final         Passed - Patient is not pregnant      Passed - Valid encounter within last 12 months    Recent Outpatient Visits           3 weeks ago Acute pain of right shoulder   [facility] M[facility], Oswaldo Conroy, PA-C   4 months ago Elevated blood pressure, situational   [place] [facility] [name], MD   9 months ago Pure hypertriglyceridemia   [facility] Margarita Mail, DO   1 year ago Carpal tunnel syndrome of left wrist   [facility] Margarita Mail, DO   1 year ago Pure hypertriglyceridemia   [facility] Margarita Mail, DO       Future Appointments             In 2 months Margarita Mail, DO [facility]

## 2023-03-17 ENCOUNTER — Other Ambulatory Visit: Payer: Self-pay | Admitting: Internal Medicine

## 2023-03-17 DIAGNOSIS — F411 Generalized anxiety disorder: Secondary | ICD-10-CM

## 2023-03-17 DIAGNOSIS — F331 Major depressive disorder, recurrent, moderate: Secondary | ICD-10-CM

## 2023-03-17 NOTE — Telephone Encounter (Signed)
Requested medications are due for refill today.  yes  Requested medications are on the active medications list.  yes  Last refill. 03/21/2022 #90 3 rf  Future visit scheduled.   yes  Notes to clinic.  Labs are about to expire.     Requested Prescriptions  Pending Prescriptions Disp Refills   venlafaxine XR (EFFEXOR-XR) 150 MG 24 hr capsule [Pharmacy Med Name: VENLAFAXINE HCL ER 150 MG CAP] 90 capsule 3    Sig: TAKE 1 CAPSULE BY MOUTH EVERY DAY     Psychiatry: Antidepressants - SNRI - desvenlafaxine & venlafaxine Failed - 03/17/2023  3:20 PM      Failed - Cr in normal range and within 360 days    Creat  Date Value Ref Range Status  03/21/2022 0.95 0.60 - 1.26 mg/dL Final         Failed - Lipid Panel in normal range within the last 12 months    Cholesterol  Date Value Ref Range Status  03/21/2022 492 (H) <200 mg/dL Final   LDL Cholesterol (Calc)  Date Value Ref Range Status  03/21/2022  mg/dL (calc) Final    Comment:    . LDL cholesterol not calculated. Triglyceride levels greater than 400 mg/dL invalidate calculated LDL results. . Reference range: <100 . Desirable range <100 mg/dL for primary prevention;   <70 mg/dL for patients with CHD or diabetic patients  with > or = 2 CHD risk factors. Marland Kitchen LDL-C is now calculated using the Martin-Hopkins  calculation, which is a validated novel method providing  better accuracy than the Friedewald equation in the  estimation of LDL-C.  [name] Lenox Ahr. 8295;621(30): 2061-2068  (http://education.[facility].com/faq/FAQ164)    HDL  Date Value Ref Range Status  03/21/2022 32 (L) > OR = 40 mg/dL Final   Triglycerides  Date Value Ref Range Status  03/21/2022 1,337 (H) <150 mg/dL Final    Comment:    Verified by repeat analysis. Marland Kitchen . If a non-fasting specimen was collected, consider repeat triglyceride testing on a fasting specimen if clinically indicated.  [name] J. of Clin. Lipidol.  2015;9:129-169. . . There is increased risk of pancreatitis when the  triglyceride concentration is very high  (> or = 500 mg/dL, especially if > or = 8657 mg/dL).  [name] J. of Clin. Lipidol. 2015;9:129-169. Marland Kitchen          Passed - Completed PHQ-2 or PHQ-9 in the last 360 days      Passed - Last BP in normal range    BP Readings from Last 1 Encounters:  12/20/22 136/85         Passed - Valid encounter within last 6 months    Recent Outpatient Visits           2 months ago Acute pain of right shoulder   [facility] Mecum, Oswaldo Conroy, PA-C   6 months ago Elevated blood pressure, situational   [facility] Cary [name], MD   12 months ago Pure hypertriglyceridemia   [facility] Margarita Mail, DO   1 year ago Carpal tunnel syndrome of left wrist   [facility] Margarita Mail, DO   1 year ago Pure hypertriglyceridemia   [facility] Margarita Mail, DO       Future Appointments             In 1 week Margarita Mail, DO [facility]

## 2023-03-27 ENCOUNTER — Ambulatory Visit: Payer: Self-pay | Admitting: Internal Medicine

## 2023-04-08 ENCOUNTER — Ambulatory Visit: Payer: Self-pay | Admitting: Internal Medicine

## 2023-04-13 ENCOUNTER — Other Ambulatory Visit: Payer: Self-pay | Admitting: Family Medicine

## 2023-04-13 DIAGNOSIS — F411 Generalized anxiety disorder: Secondary | ICD-10-CM

## 2023-04-13 DIAGNOSIS — F331 Major depressive disorder, recurrent, moderate: Secondary | ICD-10-CM

## 2023-05-15 ENCOUNTER — Other Ambulatory Visit: Payer: Self-pay | Admitting: Internal Medicine

## 2023-05-15 DIAGNOSIS — F411 Generalized anxiety disorder: Secondary | ICD-10-CM

## 2023-05-15 DIAGNOSIS — F331 Major depressive disorder, recurrent, moderate: Secondary | ICD-10-CM

## 2023-05-15 NOTE — Telephone Encounter (Signed)
 90 day supply not appropriate. Due for an appointment please call office to schedule. Requested Prescriptions  Pending Prescriptions Disp Refills   venlafaxine  XR (EFFEXOR -XR) 150 MG 24 hr capsule [Pharmacy Med Name: VENLAFAXINE  HCL ER 150 MG CAP] 90 capsule 1    Sig: TAKE 1 CAPSULE BY MOUTH EVERY DAY     Psychiatry: Antidepressants - SNRI - desvenlafaxine & venlafaxine  Failed - 05/15/2023  4:53 PM      Failed - Cr in normal range and within 360 days    Creat  Date Value Ref Range Status  03/21/2022 0.95 0.60 - 1.26 mg/dL Final         Failed - Valid encounter within last 6 months    Recent Outpatient Visits   None            Failed - Lipid Panel in normal range within the last 12 months    Cholesterol  Date Value Ref Range Status  03/21/2022 492 (H) <200 mg/dL Final   LDL Cholesterol (Calc)  Date Value Ref Range Status  03/21/2022  mg/dL (calc) Final    Comment:    . LDL cholesterol not calculated. Triglyceride levels greater than 400 mg/dL invalidate calculated LDL results. . Reference range: <100 . Desirable range <100 mg/dL for primary prevention;   <70 mg/dL for patients with CHD or diabetic patients  with > or = 2 CHD risk factors. SABRA LDL-C is now calculated using the Martin-Hopkins  calculation, which is a validated novel method providing  better accuracy than the Friedewald equation in the  estimation of LDL-C.  Gladis APPLETHWAITE et al. SANDREA. 7986;689(80): 2061-2068  (http://education.[facility].com/faq/FAQ164)    HDL  Date Value Ref Range Status  03/21/2022 32 (L) > OR = 40 mg/dL Final   Triglycerides  Date Value Ref Range Status  03/21/2022 1,337 (H) <150 mg/dL Final    Comment:    Verified by repeat analysis. SABRA . If a non-fasting specimen was collected, consider repeat triglyceride testing on a fasting specimen if clinically indicated.  Veatrice et al. J. of Clin. Lipidol. 2015;9:129-169. . . There is increased risk of pancreatitis when the   triglyceride concentration is very high  (> or = 500 mg/dL, especially if > or = 8999 mg/dL).  Veatrice et al. J. of Clin. Lipidol. 2015;9:129-169. SABRA          Passed - Completed PHQ-2 or PHQ-9 in the last 360 days      Passed - Last BP in normal range    BP Readings from Last 1 Encounters:  12/20/22 136/85

## 2023-05-18 ENCOUNTER — Other Ambulatory Visit: Payer: Self-pay | Admitting: Internal Medicine

## 2023-05-18 DIAGNOSIS — F331 Major depressive disorder, recurrent, moderate: Secondary | ICD-10-CM

## 2023-05-18 DIAGNOSIS — F411 Generalized anxiety disorder: Secondary | ICD-10-CM

## 2023-05-18 NOTE — Telephone Encounter (Signed)
 Copied from CRM [id number]. Topic: Clinical - Medication Refill >> May 18, 2023  3:38 PM Carlatta H wrote: Most Recent Primary Care Visit:  Provider: MECUM, ERIN E  Department: [facility]  Visit Type: OFFICE VISIT  Date: 12/18/2022  Medication: venlafaxine  XR (EFFEXOR -XR) 150 MG 24 hr capsule [[id number]]  Has the patient contacted their pharmacy? No (Agent: If no, request that the patient contact the pharmacy for the refill. If patient does not wish to contact the pharmacy document the reason why and proceed with request.) (Agent: If yes, when and what did the pharmacy advise?)  Is this the correct pharmacy for this prescription? Yes If no, delete pharmacy and type the correct one.  This is the patient's preferred pharmacy:  [id number] [place] [facility] - [address] [place] [address] Phone: [phone] Fax: [phone]    Has the prescription been filled recently? No  Is the patient out of the medication? Yes  Has the patient been seen for an appointment in the last year OR does the patient have an upcoming appointment? Yes  Can we respond through MyChart? No  Agent: Please be advised that Rx refills may take up to 3 business days. We ask that you follow-up with your pharmacy.

## 2023-05-19 ENCOUNTER — Other Ambulatory Visit: Payer: Self-pay | Admitting: Internal Medicine

## 2023-05-19 DIAGNOSIS — F331 Major depressive disorder, recurrent, moderate: Secondary | ICD-10-CM

## 2023-05-19 DIAGNOSIS — F411 Generalized anxiety disorder: Secondary | ICD-10-CM

## 2023-05-19 MED ORDER — VENLAFAXINE HCL ER 150 MG PO CP24: 150.0000 mg | ORAL_CAPSULE | Freq: Every day | ORAL | 0 refills | Status: DC

## 2023-05-19 NOTE — Telephone Encounter (Signed)
 Requested medications are due for refill today.  yes  Requested medications are on the active medications list.  yes  Last refill. 04/13/2023 #30 0 rf  Future visit scheduled.   yes  Notes to clinic.  Labs are expired.    Requested Prescriptions  Pending Prescriptions Disp Refills   venlafaxine  XR (EFFEXOR -XR) 150 MG 24 hr capsule 30 capsule 0    Sig: Take by mouth daily.     Psychiatry: Antidepressants - SNRI - desvenlafaxine & venlafaxine  Failed - 05/19/2023  1:56 PM      Failed - Cr in normal range and within 360 days    Creat  Date Value Ref Range Status  03/21/2022 0.95 0.60 - 1.26 mg/dL Final         Failed - Valid encounter within last 6 months    Recent Outpatient Visits   None            Failed - Lipid Panel in normal range within the last 12 months    Cholesterol  Date Value Ref Range Status  03/21/2022 492 (H) <200 mg/dL Final   LDL Cholesterol (Calc)  Date Value Ref Range Status  03/21/2022  mg/dL (calc) Final    Comment:    . LDL cholesterol not calculated. Triglyceride levels greater than 400 mg/dL invalidate calculated LDL results. . Reference range: <100 . Desirable range <100 mg/dL for primary prevention;   <70 mg/dL for patients with CHD or diabetic patients  with > or = 2 CHD risk factors. Aaron Aas LDL-C is now calculated using the Martin-Hopkins  calculation, which is a validated novel method providing  better accuracy than the Friedewald equation in the  estimation of LDL-C.  Melinda Sprawls et al. Erroll Heard. 1191;478(29): 2061-2068  (http://education.[facility].com/faq/FAQ164)    HDL  Date Value Ref Range Status  03/21/2022 32 (L) > OR = 40 mg/dL Final   Triglycerides  Date Value Ref Range Status  03/21/2022 1,337 (H) <150 mg/dL Final    Comment:    Verified by repeat analysis. Aaron Aas . If a non-fasting specimen was collected, consider repeat triglyceride testing on a fasting specimen if clinically indicated.  Imagene Mam et al. J. of Clin.  Lipidol. 2015;9:129-169. . . There is increased risk of pancreatitis when the  triglyceride concentration is very high  (> or = 500 mg/dL, especially if > or = 5621 mg/dL).  Imagene Mam et al. J. of Clin. Lipidol. 2015;9:129-169. Aaron Aas          Passed - Completed PHQ-2 or PHQ-9 in the last 360 days      Passed - Last BP in normal range    BP Readings from Last 1 Encounters:  12/20/22 136/85

## 2023-05-19 NOTE — Telephone Encounter (Signed)
 Requested Prescriptions  Pending Prescriptions Disp Refills   venlafaxine  XR (EFFEXOR -XR) 150 MG 24 hr capsule [Pharmacy Med Name: VENLAFAXINE  HCL ER 150 MG CAP] 30 capsule 0    Sig: TAKE 1 CAPSULE BY MOUTH EVERY DAY     Psychiatry: Antidepressants - SNRI - desvenlafaxine & venlafaxine  Failed - 05/19/2023  4:17 PM      Failed - Cr in normal range and within 360 days    Creat  Date Value Ref Range Status  03/21/2022 0.95 0.60 - 1.26 mg/dL Final         Failed - Valid encounter within last 6 months    Recent Outpatient Visits   None            Failed - Lipid Panel in normal range within the last 12 months    Cholesterol  Date Value Ref Range Status  03/21/2022 492 (H) <200 mg/dL Final   LDL Cholesterol (Calc)  Date Value Ref Range Status  03/21/2022  mg/dL (calc) Final    Comment:    . LDL cholesterol not calculated. Triglyceride levels greater than 400 mg/dL invalidate calculated LDL results. . Reference range: <100 . Desirable range <100 mg/dL for primary prevention;   <70 mg/dL for patients with CHD or diabetic patients  with > or = 2 CHD risk factors. Aaron Aas LDL-C is now calculated using the Martin-Hopkins  calculation, which is a validated novel method providing  better accuracy than the Friedewald equation in the  estimation of LDL-C.  Melinda Sprawls et al. Erroll Heard. 0865;784(69): 2061-2068  (http://education.[facility].com/faq/FAQ164)    HDL  Date Value Ref Range Status  03/21/2022 32 (L) > OR = 40 mg/dL Final   Triglycerides  Date Value Ref Range Status  03/21/2022 1,337 (H) <150 mg/dL Final    Comment:    Verified by repeat analysis. Aaron Aas . If a non-fasting specimen was collected, consider repeat triglyceride testing on a fasting specimen if clinically indicated.  Imagene Mam et al. J. of Clin. Lipidol. 2015;9:129-169. . . There is increased risk of pancreatitis when the  triglyceride concentration is very high  (> or = 500 mg/dL, especially if > or = 6295  mg/dL).  Imagene Mam et al. J. of Clin. Lipidol. 2015;9:129-169. Aaron Aas          Passed - Completed PHQ-2 or PHQ-9 in the last 360 days      Passed - Last BP in normal range    BP Readings from Last 1 Encounters:  12/20/22 136/85

## 2023-05-26 ENCOUNTER — Other Ambulatory Visit: Payer: Self-pay

## 2023-05-26 ENCOUNTER — Encounter: Payer: Self-pay | Admitting: Internal Medicine

## 2023-05-26 ENCOUNTER — Ambulatory Visit (INDEPENDENT_AMBULATORY_CARE_PROVIDER_SITE_OTHER): Admitting: Internal Medicine

## 2023-05-26 VITALS — BP 118/72 | HR 85 | Temp 97.9°F | Resp 16 | Ht 70.0 in | Wt 204.6 lb

## 2023-05-26 DIAGNOSIS — F331 Major depressive disorder, recurrent, moderate: Secondary | ICD-10-CM | POA: Diagnosis not present

## 2023-05-26 DIAGNOSIS — F411 Generalized anxiety disorder: Secondary | ICD-10-CM | POA: Diagnosis not present

## 2023-05-26 DIAGNOSIS — E781 Pure hyperglyceridemia: Secondary | ICD-10-CM

## 2023-05-26 LAB — CBC WITH DIFFERENTIAL/PLATELET
Absolute Lymphocytes: 3005 {cells}/uL (ref 850–3900)
Absolute Monocytes: 979 {cells}/uL — ABNORMAL HIGH (ref 200–950)
Basophils Absolute: 96 {cells}/uL (ref 0–200)
Basophils Relative: 1 %
Eosinophils Absolute: 384 {cells}/uL (ref 15–500)
Eosinophils Relative: 4 %
HCT: 48.9 % (ref 38.5–50.0)
Hemoglobin: 16.3 g/dL (ref 13.2–17.1)
MCH: 28.8 pg (ref 27.0–33.0)
MCHC: 33.3 g/dL (ref 32.0–36.0)
MCV: 86.4 fL (ref 80.0–100.0)
MPV: 11 fL (ref 7.5–12.5)
Monocytes Relative: 10.2 %
Neutro Abs: 5136 {cells}/uL (ref 1500–7800)
Neutrophils Relative %: 53.5 %
Platelets: 273 10*3/uL (ref 140–400)
RBC: 5.66 10*6/uL (ref 4.20–5.80)
RDW: 14.2 % (ref 11.0–15.0)
Total Lymphocyte: 31.3 %
WBC: 9.6 10*3/uL (ref 3.8–10.8)

## 2023-05-26 LAB — COMPLETE METABOLIC PANEL WITHOUT GFR
AG Ratio: 1.5 (calc) (ref 1.0–2.5)
ALT: 57 U/L — ABNORMAL HIGH (ref 9–46)
AST: 32 U/L (ref 10–40)
Albumin: 4.8 g/dL (ref 3.6–5.1)
Alkaline phosphatase (APISO): 103 U/L (ref 36–130)
BUN: 12 mg/dL (ref 7–25)
CO2: 30 mmol/L (ref 20–32)
Calcium: 10.3 mg/dL (ref 8.6–10.3)
Chloride: 99 mmol/L (ref 98–110)
Creat: 0.99 mg/dL (ref 0.60–1.26)
Globulin: 3.3 g/dL (ref 1.9–3.7)
Glucose, Bld: 87 mg/dL (ref 65–99)
Potassium: 4.7 mmol/L (ref 3.5–5.3)
Sodium: 137 mmol/L (ref 135–146)
Total Bilirubin: 0.5 mg/dL (ref 0.2–1.2)
Total Protein: 8.1 g/dL (ref 6.1–8.1)

## 2023-05-26 LAB — LIPID PANEL
Cholesterol: 331 mg/dL — ABNORMAL HIGH (ref <200)
HDL: 33 mg/dL — ABNORMAL LOW (ref >=40)
Non-HDL Cholesterol (Calc): 298 mg/dL — ABNORMAL HIGH (ref <130)
Total CHOL/HDL Ratio: 10 (calc) — ABNORMAL HIGH (ref <5.0)
Triglycerides: 888 mg/dL — ABNORMAL HIGH (ref <150)

## 2023-05-26 MED ORDER — ROSUVASTATIN CALCIUM 10 MG PO TABS: 10.0000 mg | ORAL_TABLET | Freq: Every day | ORAL | 3 refills | Status: AC

## 2023-05-26 MED ORDER — VENLAFAXINE HCL ER 150 MG PO CP24: 150.0000 mg | ORAL_CAPSULE | Freq: Every day | ORAL | 3 refills | Status: AC

## 2023-05-26 MED ORDER — FENOFIBRATE 145 MG PO TABS: 145.0000 mg | ORAL_TABLET | Freq: Every day | ORAL | 3 refills | Status: AC

## 2023-05-26 NOTE — Progress Notes (Signed)
 Established Office Visit  Subjective:    Patient ID: Adam Palmer, male    DOB: 02-07-1990, 33 y.o.   MRN: [id number]  Chief Complaint  Patient presents with   Medical Management of Chronic Issues    HPI Patient is in today for follow up on chronic medical conditions.   Anxiety: -Duration:stable -Anxious mood: yes      05/26/2023    9:52 AM 12/18/2022   10:10 AM 09/02/2022    8:25 AM 03/21/2022   11:13 AM 10/07/2021    3:16 PM  Depression screen PHQ 2/9  Decreased Interest 0 0 0 0 0  Down, Depressed, Hopeless 0 0 0 0 2  PHQ - 2 Score 0 0 0 0 2  Altered sleeping 0 0 0 0 0  Tired, decreased energy 0 0 0 0 0  Change in appetite 0 0 0 0 0  Feeling bad or failure about yourself  0 0 0 0 0  Trouble concentrating 0 0 0 0 0  Moving slowly or fidgety/restless 0 0 0 0 0  Suicidal thoughts 0 0 0 0 0  PHQ-9 Score 0 0 0 0 2  Difficult doing work/chores Not difficult at all Not difficult at all  Not difficult at all Not difficult at all   -Current Treatments: Effexor  150 mg daily -Patient is compliant with the above medications at above dose and reports no side effects.  -Past Treatments: Trintellix , Effexor , Xanax , Buspar , Lexapro  -Counseling: No   HLD: -Medications: Nothing currently - was told he couldn't drink with Tricor  so stopped it and not taking statin, uncertain why -Failed Medications: Vascepa  caused headaches, no issues with Crestor    -Last lipid panel: Lipid Panel     Component Value Date/Time   CHOL 492 (H) 03/21/2022 1131   TRIG 1,337 (H) 03/21/2022 1131   HDL 32 (L) 03/21/2022 1131   CHOLHDL 15.4 (H) 03/21/2022 1131   LDLCALC  03/21/2022 1131     Comment:     . LDL cholesterol not calculated. Triglyceride levels greater than 400 mg/dL invalidate calculated LDL results. . Reference range: <100 . Desirable range <100 mg/dL for primary prevention;   <70 mg/dL for patients with CHD or diabetic patients  with > or = 2 CHD risk factors. Aaron Aas LDL-C is  now calculated using the Martin-Hopkins  calculation, which is a validated novel method providing  better accuracy than the Friedewald equation in the  estimation of LDL-C.  Melinda Sprawls et al. Erroll Heard. 1027;253(66): 2061-2068  (http://education.[facility].com/faq/FAQ164)    Health Maintenance: -Blood work due   Past Medical History:  Diagnosis Date   Anxiety    Depression    History of kidney stones    Kidney calculi     Past Surgical History:  Procedure Laterality Date   APPENDECTOMY     IR URETERAL STENT LEFT NEW ACCESS W/O SEP NEPHROSTOMY CATH  01/31/2019   NEPHROLITHOTOMY Left 01/31/2019   Procedure: NEPHROLITHOTOMY PERCUTANEOUS/ INTERVENTIONAL RADIOLOGY TO PLACE NEPHROSTOMY TUBE PRIOR;  Surgeon: Florencio Hunting, MD;  Location: [facility];  Service: Urology;  Laterality: Left;    Family History  Problem Relation Age of Onset   Anxiety disorder Mother    Anxiety disorder Father    Kidney disease Neg Hx    Prostate cancer Neg Hx    Bladder Cancer Neg Hx    Kidney cancer Neg Hx     Social History   Socioeconomic History   Marital status: Married    Spouse name: Gabriel John  Number of children: 3   Years of education: Not on file   Highest education level: Not on file  Occupational History   Not on file  Tobacco Use   Smoking status: Former    Current packs/day: 0.50    Average packs/day: 0.5 packs/day for 4.0 years (2.0 ttl pk-yrs)    Types: Cigarettes   Smokeless tobacco: Current    Types: Chew   Tobacco comments:    quit 9 years  Vaping Use   Vaping status: Never Used  Substance and Sexual Activity   Alcohol use: Yes    Comment: rare   Drug use: No   Sexual activity: Yes    Partners: Female  Other Topics Concern   Not on file  Social History Narrative   Married, Wife is Gabriel John, has a 80 (son), 6 (son), and 3yo (daughter)   Social Drivers of Corporate investment banker Strain: Low Risk  (10/29/2017)   Overall Financial Resource Strain (CARDIA)     Difficulty of Paying Living Expenses: Not hard at all  Food Insecurity: No Food Insecurity (10/29/2017)   Hunger Vital Sign    Worried About Running Out of Food in the Last Year: Never true    Ran Out of Food in the Last Year: Never true  Transportation Needs: No Transportation Needs (10/29/2017)   PRAPARE - Administrator, Civil Service (Medical): No    Lack of Transportation (Non-Medical): No  Physical Activity: Sufficiently Active (01/29/2018)   Exercise Vital Sign    Days of Exercise per Week: 4 days    Minutes of Exercise per Session: 60 min  Stress: Stress Concern Present (01/29/2018)   [employer] of Occupational Health - Occupational Stress Questionnaire    Feeling of Stress : To some extent  Social Connections: Unknown (01/29/2018)   Social Connection and Isolation Panel [NHANES]    Frequency of Communication with Friends and Family: Not on file    Frequency of Social Gatherings with Friends and Family: Once a week    Attends Religious Services: Not on file    Active Member of Clubs or Organizations: Not on file    Attends Banker Meetings: Not on file    Marital Status: Not on file  Intimate Partner Violence: Not At Risk (10/29/2017)   Humiliation, Afraid, Rape, and Kick questionnaire    Fear of Current or Ex-Partner: No    Emotionally Abused: No    Physically Abused: No    Sexually Abused: No    Outpatient Medications Prior to Visit  Medication Sig Dispense Refill   venlafaxine  XR (EFFEXOR -XR) 150 MG 24 hr capsule Take 1 capsule (150 mg total) by mouth daily. 30 capsule 0   albuterol  (VENTOLIN  HFA) 108 (90 Base) MCG/ACT inhaler Inhale 2 puffs into the lungs every 6 (six) hours as needed for wheezing or shortness of breath. 18 g 0   cyclobenzaprine  (FLEXERIL ) 10 MG tablet Take 1 tablet (10 mg total) by mouth 3 (three) times daily as needed for muscle spasms. 30 tablet 0   fenofibrate  (TRICOR ) 145 MG tablet Take 1 tablet (145 mg total) by mouth  daily. 90 tablet 2   meloxicam  (MOBIC ) 7.5 MG tablet TAKE 1 TABLET BY MOUTH EVERY DAY 30 tablet 0   predniSONE  (DELTASONE ) 20 MG tablet Take 60mg  PO daily x 2 days, then40mg  PO daily x 2 days, then 20mg  PO daily x 3 days 13 tablet 0   rosuvastatin  (CRESTOR ) 10 MG tablet Take 1  tablet (10 mg total) by mouth daily. 90 tablet 2   No facility-administered medications prior to visit.    No Known Allergies  Review of Systems  All other systems reviewed and are negative.      Objective:    Physical Exam Constitutional:      Appearance: Normal appearance.  HENT:     Head: Normocephalic and atraumatic.  Eyes:     Conjunctiva/sclera: Conjunctivae normal.  Cardiovascular:     Rate and Rhythm: Normal rate and regular rhythm.  Pulmonary:     Effort: Pulmonary effort is normal.     Breath sounds: Normal breath sounds.  Skin:    General: Skin is warm and dry.  Neurological:     General: No focal deficit present.     Mental Status: He is alert. Mental status is at baseline.  Psychiatric:        Mood and Affect: Mood normal.        Behavior: Behavior normal.     BP 118/72 (Cuff Size: Large)   Pulse 85   Temp 97.9 F (36.6 C) (Other (Comment))   Resp 16   Ht 5\' 10"  (1.778 m)   Wt 204 lb 9.6 oz (92.8 kg)   SpO2 99%   BMI 29.36 kg/m  Wt Readings from Last 3 Encounters:  05/26/23 204 lb 9.6 oz (92.8 kg)  12/20/22 208 lb (94.3 kg)  12/18/22 209 lb 3.2 oz (94.9 kg)    Health Maintenance Due  Topic Date Due   Pneumococcal Vaccine 79-54 Years old (1 of 2 - PCV) Never done   COVID-19 Vaccine (2 - 2024-25 season) 09/28/2022     There are no preventive care reminders to display for this patient.   No results found for: "TSH" Lab Results  Component Value Date   WBC 7.2 03/21/2022   HGB 15.9 03/21/2022   HCT 46.3 03/21/2022   MCV 85.4 03/21/2022   PLT 244 03/21/2022   Lab Results  Component Value Date   NA 135 03/21/2022   K 4.5 03/21/2022   CO2 26 03/21/2022    GLUCOSE 84 03/21/2022   BUN 12 03/21/2022   CREATININE 0.95 03/21/2022   BILITOT 0.5 03/21/2022   ALKPHOS 101 08/28/2013   AST 68 (H) 03/21/2022   ALT 127 (H) 03/21/2022   PROT 8.0 03/21/2022   ALBUMIN 4.4 08/28/2013   CALCIUM  10.2 03/21/2022   ANIONGAP 10 02/03/2019   EGFR 108 03/21/2022   Lab Results  Component Value Date   CHOL 492 (H) 03/21/2022   Lab Results  Component Value Date   HDL 32 (L) 03/21/2022   Lab Results  Component Value Date   [facility]  03/21/2022     Comment:     . LDL cholesterol not calculated. Triglyceride levels greater than 400 mg/dL invalidate calculated LDL results. . Reference range: <100 . Desirable range <100 mg/dL for primary prevention;   <70 mg/dL for patients with CHD or diabetic patients  with > or = 2 CHD risk factors. Aaron Aas LDL-C is now calculated using the Martin-Hopkins  calculation, which is a validated novel method providing  better accuracy than the Friedewald equation in the  estimation of LDL-C.  Melinda Sprawls et al. Erroll Heard. 2956;213(08): 2061-2068  (http://education.[facility].com/faq/FAQ164)    Lab Results  Component Value Date   TRIG 1,337 (H) 03/21/2022   Lab Results  Component Value Date   CHOLHDL 15.4 (H) 03/21/2022   No results found for: "HGBA1C"     Assessment &  Plan:   Assessment & Plan Hypertriglyceridemia Significantly elevated triglycerides, likely genetic. High risk of pancreatitis and gallstones. Alcohol exacerbates condition. Emphasized importance of cholesterol medications. - Refill Tricor  and Crestor . - Advise on importance of taking cholesterol medications despite alcohol consumption. - Counsel on risks of alcohol consumption in relation to pancreatitis.  Risk of pancreatitis due to hypertriglyceridemia High risk due to elevated triglycerides. Cautioned about alcohol consumption. Instructed to seek immediate evaluation for severe abdominal pain. - Advise to seek immediate medical evaluation for  severe abdominal pain.  MDD Current medication includes Effexor  150 mg.  - Refill Effexor  150 mg. - Labs due.   - CBC w/Diff/Platelet - COMPLETE METABOLIC PANEL WITHOUT GFR - Lipid Profile - fenofibrate  (TRICOR ) 145 MG tablet; Take 1 tablet (145 mg total) by mouth daily.  Dispense: 90 tablet; Refill: 3 - rosuvastatin  (CRESTOR ) 10 MG tablet; Take 1 tablet (10 mg total) by mouth daily.  Dispense: 90 tablet; Refill: 3 - venlafaxine  XR (EFFEXOR -XR) 150 MG 24 hr capsule; Take 1 capsule (150 mg total) by mouth daily.  Dispense: 90 capsule; Refill: 3    Rockney Cid, DO

## 2023-05-27 ENCOUNTER — Encounter: Payer: Self-pay | Admitting: Internal Medicine

## 2024-05-26 ENCOUNTER — Ambulatory Visit: Admitting: Internal Medicine
